# Patient Record
Sex: Female | Born: 1960 | Race: Black or African American | Hispanic: No | Marital: Married | Smoking: Never smoker
Health system: Southern US, Community
[De-identification: ages and names within clinical notes are randomized; demographics above are authoritative.]

## PROBLEM LIST (undated history)

## (undated) DIAGNOSIS — G4730 Sleep apnea, unspecified: Secondary | ICD-10-CM

## (undated) DIAGNOSIS — I10 Essential (primary) hypertension: Secondary | ICD-10-CM

## (undated) DIAGNOSIS — N189 Chronic kidney disease, unspecified: Secondary | ICD-10-CM

## (undated) HISTORY — PX: GASTRIC BYPASS: SHX52

## (undated) HISTORY — PX: TONSILLECTOMY: SUR1361

## (undated) HISTORY — PX: CHOLECYSTECTOMY: SHX55

## (undated) HISTORY — PX: ABDOMINAL HYSTERECTOMY: SHX81

## (undated) HISTORY — PX: BACK SURGERY: SHX140

## (undated) HISTORY — PX: NECK SURGERY: SHX720

## (undated) HISTORY — DX: Essential (primary) hypertension: I10

## (undated) HISTORY — PX: SHOULDER SURGERY: SHX246

---

## 2007-05-18 ENCOUNTER — Ambulatory Visit: Payer: Self-pay | Admitting: Emergency Medicine

## 2009-03-29 ENCOUNTER — Ambulatory Visit: Payer: Self-pay | Admitting: Specialist

## 2009-04-07 ENCOUNTER — Ambulatory Visit: Payer: Self-pay | Admitting: Specialist

## 2013-07-11 ENCOUNTER — Ambulatory Visit: Payer: Self-pay | Admitting: Family Medicine

## 2013-11-20 ENCOUNTER — Ambulatory Visit: Payer: Self-pay | Admitting: Pain Medicine

## 2013-11-26 ENCOUNTER — Ambulatory Visit: Payer: Self-pay | Admitting: Pain Medicine

## 2013-12-23 ENCOUNTER — Ambulatory Visit: Payer: Self-pay | Admitting: Pain Medicine

## 2014-01-12 ENCOUNTER — Ambulatory Visit: Payer: Self-pay | Admitting: Pain Medicine

## 2014-01-22 ENCOUNTER — Ambulatory Visit: Payer: Self-pay | Admitting: Pain Medicine

## 2014-01-28 ENCOUNTER — Ambulatory Visit: Payer: Self-pay | Admitting: Pain Medicine

## 2014-02-18 ENCOUNTER — Ambulatory Visit: Payer: Self-pay | Admitting: Pain Medicine

## 2014-03-18 ENCOUNTER — Ambulatory Visit: Payer: Self-pay | Admitting: Pain Medicine

## 2014-06-03 ENCOUNTER — Ambulatory Visit: Payer: Self-pay | Admitting: Pain Medicine

## 2014-06-29 ENCOUNTER — Ambulatory Visit: Payer: Self-pay | Admitting: Pain Medicine

## 2014-07-29 ENCOUNTER — Ambulatory Visit: Payer: Self-pay | Admitting: Pain Medicine

## 2014-09-01 ENCOUNTER — Ambulatory Visit: Payer: Self-pay | Admitting: Pain Medicine

## 2014-09-19 ENCOUNTER — Ambulatory Visit
Admit: 2014-09-19 | Disposition: A | Discharge status: Home / Self Care | Payer: Self-pay | Attending: Family Medicine | Admitting: Family Medicine

## 2014-10-01 ENCOUNTER — Ambulatory Visit
Admit: 2014-10-01 | Disposition: A | Discharge status: Home / Self Care | Payer: Self-pay | Attending: Pain Medicine | Admitting: Pain Medicine

## 2014-10-28 ENCOUNTER — Ambulatory Visit: Payer: 59 | Attending: Pain Medicine | Admitting: Pain Medicine

## 2014-10-28 ENCOUNTER — Encounter: Payer: Self-pay | Admitting: Pain Medicine

## 2014-10-28 ENCOUNTER — Encounter (INDEPENDENT_AMBULATORY_CARE_PROVIDER_SITE_OTHER): Payer: Self-pay

## 2014-10-28 VITALS — BP 137/77 | HR 58 | Temp 98.2°F | Resp 16 | Ht 69.0 in | Wt 215.0 lb

## 2014-10-28 DIAGNOSIS — M5030 Other cervical disc degeneration, unspecified cervical region: Secondary | ICD-10-CM | POA: Insufficient documentation

## 2014-10-28 DIAGNOSIS — M533 Sacrococcygeal disorders, not elsewhere classified: Secondary | ICD-10-CM | POA: Diagnosis not present

## 2014-10-28 DIAGNOSIS — M545 Low back pain: Secondary | ICD-10-CM | POA: Diagnosis present

## 2014-10-28 DIAGNOSIS — M47816 Spondylosis without myelopathy or radiculopathy, lumbar region: Secondary | ICD-10-CM | POA: Insufficient documentation

## 2014-10-28 DIAGNOSIS — M542 Cervicalgia: Secondary | ICD-10-CM | POA: Diagnosis present

## 2014-10-28 DIAGNOSIS — Z981 Arthrodesis status: Secondary | ICD-10-CM | POA: Diagnosis not present

## 2014-10-28 DIAGNOSIS — M5136 Other intervertebral disc degeneration, lumbar region: Secondary | ICD-10-CM | POA: Insufficient documentation

## 2014-10-28 DIAGNOSIS — M51369 Other intervertebral disc degeneration, lumbar region without mention of lumbar back pain or lower extremity pain: Secondary | ICD-10-CM | POA: Insufficient documentation

## 2014-10-28 DIAGNOSIS — M79605 Pain in left leg: Secondary | ICD-10-CM | POA: Diagnosis present

## 2014-10-28 MED ORDER — HYDROCODONE-ACETAMINOPHEN 5-325 MG PO TABS: ORAL_TABLET | ORAL | Status: DC

## 2014-10-28 MED ORDER — DICLOFENAC EPOLAMINE 1.3 % TD PTCH: MEDICATED_PATCH | TRANSDERMAL | Status: DC

## 2014-10-28 NOTE — Progress Notes (Signed)
Discharged at 1310, ambulatory

## 2014-10-28 NOTE — Patient Instructions (Signed)
Continue present medications.  F/U PCP for evaliation of  BP and general medical  condition.  F/U surgical evaluation.  F/U neurological evaluation.  May consider radiofrequency rhizolysis or intraspinal procedures pending response to present treatment and F/U evaluation.  Patient to call [facility] should patient have concerns prior to scheduled return appointment.  

## 2014-10-28 NOTE — Progress Notes (Signed)
   Subjective:    Patient ID: Nancy MoorsBerlinda Lunsford Bowlby, female    DOB: 11/12/1960, 54 y.o.   MRN: [id number]  HPI    Review of Systems     Objective:   Physical Exam        Assessment & Plan:

## 2014-10-28 NOTE — Progress Notes (Signed)
   Subjective:    Patient ID: Nancy MoorsBerlinda Lunsford Lormand, female    DOB: 03/29/1961, 54 y.o.   MRN: [id number]  HPI  The patient is a 54 year old female who [facility] for further evaluation and treatment of pain involving the lower back and lower extremity regions as well as the neck. The patient is with pain well-controlled at this time and continues her medications of hydrocodone and the use of the Flector patch. We will avoid interventional treatment and continue present treatment regimen. We'll also advise patient to continue her exercise routine which appears to be benefiting patient significantly.  Review of Systems     Objective:   Physical Exam  There was tenderness of the splenius capitis and occipitalis musculature regions of mild degree with well-healed surgical scar of the cervical region without increased warmth or erythema in the region of the scar. There was tenderness of the acromioclavicular and glenohumeral joint regions with patient being with what appeared to be bilaterally equal grip strength. Palpation over the thoracic facet reproducing minimal discomfort there was minimal increased pain with Tinel and Phalen's maneuver. Palpation over the lumbar facet lumbar paraspinal musculature region reproduces minimal discomfort. There was tenderness over the region of the lumbar paraspinal musculature region on the right greater than the left. Palpation of the PSIS and PII S regions reproduced mild discomfort and straight leg raising was tolerated to approximately 20 withquestionable increase of pain with dorsiflexion noted. Negative clonus negative Homans . Abdomen nontender and no costovertebral angle tenderness noted      Assessment & Plan:    Degenerative disc disease lumbar spine  Lumbar facet syndrome  Degenerative disc disease cervical spine  Cervical facet syndrome  Status post lumbar fusion  Status post cervical fusion  Sacroiliac joint  dysfunction    Plan  Continue present medications.  F/U PCP for evaliation of  BP and general medical  condition.  F/U surgical evaluation.  F/U neurological evaluation.  May consider radiofrequency rhizolysis or intraspinal procedures pending response to present treatment and F/U evaluation.  Patient to call [facility] should patient have concerns prior to scheduled return appointment.

## 2014-12-17 ENCOUNTER — Encounter: Payer: Self-pay | Admitting: Pain Medicine

## 2014-12-17 ENCOUNTER — Ambulatory Visit: Payer: 59 | Attending: Pain Medicine | Admitting: Pain Medicine

## 2014-12-17 VITALS — BP 115/60 | HR 61 | Temp 98.2°F | Resp 18 | Ht 69.0 in | Wt 215.0 lb

## 2014-12-17 DIAGNOSIS — R51 Headache: Secondary | ICD-10-CM | POA: Diagnosis present

## 2014-12-17 DIAGNOSIS — M5136 Other intervertebral disc degeneration, lumbar region: Secondary | ICD-10-CM | POA: Insufficient documentation

## 2014-12-17 DIAGNOSIS — M5481 Occipital neuralgia: Secondary | ICD-10-CM | POA: Diagnosis not present

## 2014-12-17 DIAGNOSIS — M5030 Other cervical disc degeneration, unspecified cervical region: Secondary | ICD-10-CM | POA: Insufficient documentation

## 2014-12-17 DIAGNOSIS — M533 Sacrococcygeal disorders, not elsewhere classified: Secondary | ICD-10-CM | POA: Diagnosis not present

## 2014-12-17 DIAGNOSIS — M47816 Spondylosis without myelopathy or radiculopathy, lumbar region: Secondary | ICD-10-CM

## 2014-12-17 DIAGNOSIS — Z9889 Other specified postprocedural states: Secondary | ICD-10-CM | POA: Insufficient documentation

## 2014-12-17 DIAGNOSIS — G43909 Migraine, unspecified, not intractable, without status migrainosus: Secondary | ICD-10-CM | POA: Insufficient documentation

## 2014-12-17 DIAGNOSIS — Z981 Arthrodesis status: Secondary | ICD-10-CM

## 2014-12-17 DIAGNOSIS — M542 Cervicalgia: Secondary | ICD-10-CM | POA: Diagnosis present

## 2014-12-17 MED ORDER — DICLOFENAC EPOLAMINE 1.3 % TD PTCH: MEDICATED_PATCH | TRANSDERMAL | Status: DC

## 2014-12-17 MED ORDER — HYDROCODONE-ACETAMINOPHEN 5-325 MG PO TABS: ORAL_TABLET | ORAL | Status: DC

## 2014-12-17 NOTE — Progress Notes (Signed)
Safety precautions to be maintained throughout the outpatient stay will include: orient to surroundings, keep bed in low position, maintain call bell within reach at all times, provide assistance with transfer out of bed and ambulation.  

## 2014-12-17 NOTE — Progress Notes (Signed)
Subjective:    Patient ID: Nancy Jacobson, female    DOB: 05/02/1961, 54 y.o.   MRN: [id number]  HPI  Patient is 54 year old female returns [facility] for further evaluation and treatment of pain involving the neck headaches lower back and lower extremity region. Patient states that her pain of the lower back and lower extremity regions fairly well-controlled at this time. Patient is with prior surgical intervention of the cervical region and states that she has significant pain occurring from the back of the neck radiating to the back of the head and especially on the right side. Patient denies trauma change in events of daily living the call significant change in symptomatology. We discussed patient's overall condition informed patient that she appears to have a component of greater occipital neuralgia contributing to headaches with pain in the cervical region and pain radiating cervical region to the occipital region of the head. So informed patient of the potential for the greater occipital neuralgia headaches to precipitate migraine headaches. After discussion of patient's condition and decision was made to schedule patient for greater occipital nerve block to be performed at time return appointment to decrease severity of patient's symptoms, minimize progression of symptoms, and avoid the need for more involved treatment. The patient was in agreement with suggested treatment plan. Patient will continue hydrocodone acetaminophen and Flector patch at this time as prescribed. The patient was understanding and agree with suggested treatment plan        Review of Systems     Objective:   Physical Exam    there was tenderness of the splenius capitis and occipitalis musculature region palpation was reproduced pain of moderate to moderately severe degree with severe increased pain on the right compared to the left. No new lesions of the head and neck were noted. There was  no excessive tends to palpation of the region of the sinuses. There was minimal tenderness of 10 to palpation of the temporomandibular joint region. Palpation of the splenius capitate and occipitalis musculature region reproduced severe discomfort. Patient appeared to be unremarkable Spurling's maneuver. Patient appeared to be with bilaterally equal grip strength. Tinel and Phalen's maneuver without increased pain of any significant degree. Palpation thoracic facet thoracic paraspinal musculature region was associated with increased pain of mild degree. No crepitus of the thoracic region was noted. Palpation over the lumbar paraspinal musculature region lumbar facet region what was tends to palpation of mild degree with lateral bending rotation extension and palpation of the lumbar facets reproducing mild discomfort. There was mild to moderate tenderness of the PSIS and PII S regions. Negative clonus negative Homans. DTRs difficult to the expected difficulty relaxing. Abdomen nontender with no costovertebral maintenance noted.      Assessment & Plan:    Degenerative disc disease cervical spine Status post surgery of cervical region  Bilateral occipital neuralgia  Migraine headaches (history of)  Sacroiliac joint dysfunction  Degenerative disc disease lumbar spine  Lumbar facet syndrome   Plan  Continue present medications Flector patch and hydrocodone acetaminophen  Greater occipital nerve block to be performed at time return appointment L Barry Dieneswens   F/U PCP for evaliation of  BP and general medical  Condition.  F/U podiatrist as discussed  F/U surgical evaluation  F/U neurological evaluation we discussed neurological reevaluation of headaches and we'll consider further evaluation in this regard as well   May consider radiofrequency rhizolysis or intraspinal procedures pending response to present treatment and F/U evaluation.  Patient to  call [facility] should patient  have concerns prior to scheduled return appointment.

## 2014-12-17 NOTE — Patient Instructions (Addendum)
Continue present medications Flector patch and hydrocodone acetaminophen  Greater occipital nerve block to be performed Monday, 12/28/2014  F/U PCP L Barry Dieneswens  for evaliation of  BP and general medical  condition.  F/U surgical evaluation  F/U podiatrist as planned status post surgery of foot  F/U neurological evaluation  May consider radiofrequency rhizolysis or intraspinal procedures pending response to present treatment and F/U evaluation.  Patient to call [facility] should patient have concerns prior to scheduled return appointment.  You were given a prescription for Hydrocodone today. A scriptOccipital Nerve Block Patient Information  Description: The occipital nerves originate in the cervical (neck) spinal cord and travel upward through muscle and tissue to supply sensation to the back of the head and top of the scalp.  In addition, the nerves control some of the muscles of the scalp.  Occipital neuralgia is an irritation of these nerves which can cause headaches, numbness of the scalp, and neck discomfort.     The occipital nerve block will interrupt nerve transmission through these nerves and can relieve pain and spasm.  The block consists of insertion of a small needle under the skin in the back of the head to deposit local anesthetic (numbing medicine) and/or steroids around the nerve.  The entire block usually lasts less than 5 minutes.  Conditions which may be treated by occipital blocks:   Muscular pain and spasm of the scalp  Nerve irritation, back of the head  Headaches  Upper neck pain  Preparation for the injection:  1. Do not eat any solid food or dairy products within 6 hours of your appointment. 2. You may drink clear liquids up to 2 hours before appointment.  Clear liquids include water, black coffee, juice or soda.  No milk or cream please. 3. You may take your regular medication, including pain medications, with a sip of water before you  appointment.  Diabetics should hold regular insulin (if taken separately) and take 1/2 normal NPH dose the morning of the procedure.  Carry some sugar containing items with you to your appointment. 4. A driver must accompany you and be prepared to drive you home after your procedure. 5. Bring all your current medications with you. 6. An IV may be inserted and sedation may be given at the discretion of the physician. 7. A blood pressure cuff, EKG, and other monitors will often be applied during the procedure.  Some patients may need to have extra oxygen administered for a short period. 8. You will be asked to provide medical information, including your allergies and medications, prior to the procedure.  We must know immediately if you are taking blood thinners (like Coumadin/Warfarin) or if you are allergic to IV iodine contrast (dye).  We must know if you could possible be pregnant.  9. Do not wear a high collared shirt or turtleneck.  Tie long hair up in the back if possible.  Possible side-effects:   Bleeding from needle site  Infection (rare, may require surgery)  Nerve injury (rare)  Hair on back of neck can be tinged with iodine scrub (this will wash out)  Light-headedness (temporary)  Pain at injection site (several days)  Decreased blood pressure (rare, temporary)  Seizure (very rare)  Call if you experience:   Hives or difficulty breathing ( go to the emergency room)  Inflammation or drainage at the injection site(s)  Please note:  Although the local anesthetic injected can often make your painful muscles or headache feel good for  several hours after the injection, the pain may return.  It takes 3-7 days for steroids to work.  You may not notice any pain relief for at least one week.  If effective, we will often do a series of injections spaced 3-6 weeks apart to maximally decrease your pain.  If you have any questions, please call [phone] [facility]  for Flector patch was sent to your pharmacy.

## 2014-12-28 ENCOUNTER — Ambulatory Visit: Payer: 59 | Attending: Pain Medicine | Admitting: Pain Medicine

## 2014-12-28 VITALS — BP 123/63 | HR 59 | Temp 98.6°F | Resp 16 | Ht 69.0 in | Wt 215.0 lb

## 2014-12-28 DIAGNOSIS — M533 Sacrococcygeal disorders, not elsewhere classified: Secondary | ICD-10-CM

## 2014-12-28 DIAGNOSIS — M5481 Occipital neuralgia: Secondary | ICD-10-CM | POA: Insufficient documentation

## 2014-12-28 DIAGNOSIS — M5030 Other cervical disc degeneration, unspecified cervical region: Secondary | ICD-10-CM

## 2014-12-28 DIAGNOSIS — M47816 Spondylosis without myelopathy or radiculopathy, lumbar region: Secondary | ICD-10-CM

## 2014-12-28 DIAGNOSIS — M47812 Spondylosis without myelopathy or radiculopathy, cervical region: Secondary | ICD-10-CM | POA: Insufficient documentation

## 2014-12-28 DIAGNOSIS — R51 Headache: Secondary | ICD-10-CM | POA: Insufficient documentation

## 2014-12-28 DIAGNOSIS — M5136 Other intervertebral disc degeneration, lumbar region: Secondary | ICD-10-CM

## 2014-12-28 DIAGNOSIS — Z981 Arthrodesis status: Secondary | ICD-10-CM

## 2014-12-28 DIAGNOSIS — M542 Cervicalgia: Secondary | ICD-10-CM | POA: Diagnosis present

## 2014-12-28 MED ORDER — MIDAZOLAM HCL 5 MG/5ML IJ SOLN
INTRAMUSCULAR | Status: AC
Start: 2014-12-28 — End: 2014-12-28
  Administered 2014-12-28: 3 mg via INTRAVENOUS
  Filled 2014-12-28: qty 5

## 2014-12-28 MED ORDER — CIPROFLOXACIN HCL 250 MG PO TABS: 250.0000 mg | ORAL_TABLET | Freq: Two times a day (BID) | ORAL | Status: DC

## 2014-12-28 MED ORDER — CIPROFLOXACIN IN D5W 400 MG/200ML IV SOLN
INTRAVENOUS | Status: AC
Start: 2014-12-28 — End: 2014-12-28
  Administered 2014-12-28: 12:00:00
  Filled 2014-12-28: qty 200

## 2014-12-28 MED ORDER — BUPIVACAINE HCL (PF) 0.25 % IJ SOLN
INTRAMUSCULAR | Status: AC
  Administered 2014-12-28: 12:00:00
  Filled 2014-12-28: qty 30

## 2014-12-28 MED ORDER — TRIAMCINOLONE ACETONIDE 40 MG/ML IJ SUSP
INTRAMUSCULAR | Status: AC
Start: 2014-12-28 — End: 2014-12-28
  Administered 2014-12-28: 12:00:00
  Filled 2014-12-28: qty 1

## 2014-12-28 MED ORDER — FENTANYL CITRATE (PF) 100 MCG/2ML IJ SOLN
INTRAMUSCULAR | Status: AC
  Administered 2014-12-28: 100 ug via INTRAVENOUS
  Filled 2014-12-28: qty 2

## 2014-12-28 MED ORDER — ORPHENADRINE CITRATE 30 MG/ML IJ SOLN
INTRAMUSCULAR | Status: AC
  Administered 2014-12-28: 12:00:00
  Filled 2014-12-28: qty 2

## 2014-12-28 NOTE — Patient Instructions (Addendum)
Continue present medications Flector patch and hydrocodone acetaminophen and begin taking your antibiotic Cipro today  F/U PCP L Owens for evaliation of  BP and general medical  condition.  F/U surgical evaluation  F/U neurological evaluation  May consider radiofrequency rhizolysis or intraspinal procedures pending response to present treatment and F/U evaluation.  Patient to call [facility] should patient have concerns prior to scheduled return appointment.

## 2014-12-28 NOTE — Progress Notes (Signed)
Safety precautions to be maintained throughout the outpatient stay will include: orient to surroundings, keep bed in low position, maintain call bell within reach at all times, provide assistance with transfer out of bed and ambulation.  

## 2014-12-28 NOTE — Progress Notes (Signed)
   Subjective:    Patient ID: Nancy Jacobson, female    DOB: [birth year], 54 y.o.   MRN: [id number]  HPI  NOTE: The patient is a 54 y.o.-year-old female who [facility] the [facility] for further evaluation and treatment of pain consisting of pain involving the region of the neck and headache.  Patient is with prior studies revealing patient to be with degenerative changes of the cervical spine with prior surgical intervention of the cervical region. Patient is with what appears to be significant component of headaches due to greater occipital neuralgia involving the left and right sides in addition to cervicogenic headaches and concern regarding component of migraine. At the present time patient's pain appears to be with significant component of bilateral occipital neuralgia .  The risks, benefits, and expectations of the procedure have been discussed and explained to patient, who is understanding and wishes to proceed with interventional treatment as discussed and as explained to patient.  Will proceed with greater occipital nerve blocks with myoneural block injections at this time as discussed and as explained to patient.  All are understanding and in agreement with suggested treatment plan.    PROCEDURE:  Greater occipital nerve block on the left side with IV Versed, IV Fentanyl, conscious sedation, EKG, blood pressure, pulse, pulse oximetry monitoring.  Procedure performed with patient in prone position.  Greater occipital nerve block on the left side.   With patient in prone position, Betadine prep of proposed entry site accomplished.  Following identification of the nuchal ridge, 22 -gauge needle was inserted at the level of the nuchal ridge medial to the occipital artery.  Following negative aspiration, 4cc 0.25% bupivacaine with Kenalog injected for left greater occipital nerve block.  Needle was removed.  Patient tolerated injection well.   Greater occipital nerve block on  the rightt side. The greater occipital nerve block on the right side was performed exactly as the left greater occipital nerve block was performed and utilizing the same technique.  Myoneural block injections of the cervical paraspinal musculature region Following Betadine prep of proposed entry site a 22-gauge needle was inserted in the cervical paraspinal musculature region and following negative aspiration 2 cc of 0.25% bupivacaine with Norflex was injected for myoneural block injections of the cervical paraspinal musculature region.   A total of 10 mg Kenalog was utilized for the entire procedure.  PLAN:    1. Medications: Will continue presently prescribed medications at this time. Flector patch and hydrocodone acetaminophen 2. Patient to follow up with primary care physician Bud Face for evaluation of blood pressure and general medical condition status post procedure performed on today's visit. 3. Neurological evaluation for further assessment of headaches for further studies as discussed. 4. Surgical evaluation as discussed.  5. F/U Dr.Hyatt as discussed  6. Patient may be candidate for Botox injections, radiofrequency procedures, as well as implantation type procedures pending response to treatment rendered on today's visit and pending follow-up evaluation. 7. Patient has been advised to adhere to proper body mechanics and to avoid activities which appear to aggravate condition.cations:  Will continue presently prescribed medications at this time. 8. The patient is understanding and in agreement with the suggested treatment plan.     Review of Systems     Objective:   Physical Exam        Assessment & Plan:

## 2014-12-29 ENCOUNTER — Telehealth: Payer: Self-pay | Admitting: *Deleted

## 2014-12-29 NOTE — Telephone Encounter (Signed)
voicemail

## 2014-12-31 ENCOUNTER — Other Ambulatory Visit: Payer: Self-pay | Admitting: Pain Medicine

## 2015-01-19 ENCOUNTER — Ambulatory Visit: Payer: 59 | Attending: Pain Medicine | Admitting: Pain Medicine

## 2015-01-19 VITALS — BP 126/71 | HR 64 | Temp 98.1°F | Resp 16 | Ht 69.0 in | Wt 215.0 lb

## 2015-01-19 DIAGNOSIS — Z981 Arthrodesis status: Secondary | ICD-10-CM

## 2015-01-19 DIAGNOSIS — M5030 Other cervical disc degeneration, unspecified cervical region: Secondary | ICD-10-CM | POA: Diagnosis not present

## 2015-01-19 DIAGNOSIS — M51369 Other intervertebral disc degeneration, lumbar region without mention of lumbar back pain or lower extremity pain: Secondary | ICD-10-CM

## 2015-01-19 DIAGNOSIS — G43909 Migraine, unspecified, not intractable, without status migrainosus: Secondary | ICD-10-CM | POA: Insufficient documentation

## 2015-01-19 DIAGNOSIS — M47816 Spondylosis without myelopathy or radiculopathy, lumbar region: Secondary | ICD-10-CM

## 2015-01-19 DIAGNOSIS — M542 Cervicalgia: Secondary | ICD-10-CM | POA: Diagnosis present

## 2015-01-19 DIAGNOSIS — M5481 Occipital neuralgia: Secondary | ICD-10-CM

## 2015-01-19 DIAGNOSIS — Z9889 Other specified postprocedural states: Secondary | ICD-10-CM | POA: Diagnosis not present

## 2015-01-19 DIAGNOSIS — R51 Headache: Secondary | ICD-10-CM | POA: Diagnosis present

## 2015-01-19 DIAGNOSIS — M533 Sacrococcygeal disorders, not elsewhere classified: Secondary | ICD-10-CM | POA: Diagnosis not present

## 2015-01-19 DIAGNOSIS — M5136 Other intervertebral disc degeneration, lumbar region: Secondary | ICD-10-CM | POA: Diagnosis not present

## 2015-01-19 DIAGNOSIS — M545 Low back pain: Secondary | ICD-10-CM | POA: Diagnosis present

## 2015-01-19 MED ORDER — HYDROCODONE-ACETAMINOPHEN 5-325 MG PO TABS: ORAL_TABLET | ORAL | Status: DC

## 2015-01-19 MED ORDER — GABAPENTIN 300 MG PO CAPS: ORAL_CAPSULE | ORAL | Status: DC

## 2015-01-19 NOTE — Progress Notes (Signed)
   Subjective:    Patient ID: Nancy Jacobson, female    DOB: 1960-08-10, 54 y.o.   MRN: [id number]  HPI  Patient is 54 year old female who [facility] for further evaluation and treatment of pain occurring in the recent the neck associated with headaches as well as upper mid lower back and lower extremity pain. Patient is to some pain occurring in the region of the lower back and buttock especially on the right. Modification of medications and we'll avoid nonsteroidal anti-inflammatory medications at this time. We will prescribed Neurontin and observe patient's response to the Neurontin and remain available to consider for interventional treatment and additional modifications of treatment pending response to Neurontin and follow-up evaluation. The patient was understanding and in agreement status treatment plan. Continue the use of the Flector patch and the hydrocodone acetaminophen at this time. The patient was understanding and in agreement status treatment plan    Review of Systems     Objective:   Physical Exam  There was tenderness over the splenius capitis and occipitalis musculature region of mild degree. There was mild tinnitus of the cervical facet cervical paraspinal musculature region. Appeared to be unremarkable Spurling's maneuver. Palpation over the thoracic facet thoracic paraspinal muscle region was with mild to moderate tends to palpation and no crepitus of the thoracic region was noted. Patient was a tends to palpation of the acromioclavicular glenohumeral joint region a mild degree and appeared to be with bilaterally equal grip strength. Tinel and Phalen's maneuver without increase of pain of significant degree.   Palpation over the lumbar paraspinal muscles region lumbar facet region was with increased pain with extension and palpation of the lumbar facets reproducing moderate discomfort on the right compared to the left. There was tenderness in  the region of the PSIS and PII S region on the right greater than the left. Mild tinnitus of the greater trochanteric region and iliotibial band region was noted. We'll tolerates approximately 30 without a definite increased pain with dorsiflexion noted. Well-healed scars of the feet were noted without increased warmth and erythema in the region of the head clonus negative Homans. Abdomen soft nontender and no costovertebral angle tenderness was noted.    Assessment & Plan:    Degenerative disc disease cervical spine Status post surgery of cervical region  Bilateral occipital neuralgia  Migraine headaches (history of)  Sacroiliac joint dysfunction  Degenerative disc disease lumbar spine  Lumbar facet syndrome   Plan  Continue present medications Flector patch and hydrocodone acetaminophen and begin Neurontin with caution to avoid excessive sedation posterior depression confusion and other side effects as discussed   F/U PCP L owensfor evaliation of  BP and general medical  condition  F/U surgical evaluation  F/U neurological evaluation  F/U podiatrist as needed  May consider radiofrequency rhizolysis or intraspinal procedures pending response to present treatment and F/U evaluation   Patient to call [facility] should patient have concerns prior to scheduled return appointmen.

## 2015-01-19 NOTE — Progress Notes (Signed)
Safety precautions to be maintained throughout the outpatient stay will include: orient to surroundings, keep bed in low position, maintain call bell within reach at all times, provide assistance with transfer out of bed and ambulation.  

## 2015-01-19 NOTE — Patient Instructions (Addendum)
Continue present medications Flector patch and hydrocodone acetaminophen and begin Neurontin as discussed. Neurontin may cause drowsiness confusion and other side effects  F/U PCP L Owens for evaliation of  BP and general medical  condition  F/U surgical evaluation  F/U neurological evaluation  F/U podiatrist as needed  May consider radiofrequency rhizolysis or intraspinal procedures pending response to present treatment and F/U evaluation   Patient to call [facility] should patient have concerns prior to scheduled return appointmen.

## 2015-02-18 ENCOUNTER — Encounter: Payer: Self-pay | Admitting: Pain Medicine

## 2015-02-18 ENCOUNTER — Ambulatory Visit: Payer: 59 | Attending: Pain Medicine | Admitting: Pain Medicine

## 2015-02-18 VITALS — BP 130/80 | HR 61 | Temp 96.3°F | Resp 18 | Ht 69.0 in | Wt 215.0 lb

## 2015-02-18 DIAGNOSIS — M47816 Spondylosis without myelopathy or radiculopathy, lumbar region: Secondary | ICD-10-CM

## 2015-02-18 DIAGNOSIS — M533 Sacrococcygeal disorders, not elsewhere classified: Secondary | ICD-10-CM | POA: Insufficient documentation

## 2015-02-18 DIAGNOSIS — M5136 Other intervertebral disc degeneration, lumbar region: Secondary | ICD-10-CM | POA: Diagnosis not present

## 2015-02-18 DIAGNOSIS — M5030 Other cervical disc degeneration, unspecified cervical region: Secondary | ICD-10-CM | POA: Diagnosis not present

## 2015-02-18 DIAGNOSIS — M542 Cervicalgia: Secondary | ICD-10-CM | POA: Diagnosis present

## 2015-02-18 DIAGNOSIS — G43909 Migraine, unspecified, not intractable, without status migrainosus: Secondary | ICD-10-CM | POA: Insufficient documentation

## 2015-02-18 DIAGNOSIS — M5481 Occipital neuralgia: Secondary | ICD-10-CM | POA: Diagnosis not present

## 2015-02-18 DIAGNOSIS — Z9889 Other specified postprocedural states: Secondary | ICD-10-CM | POA: Insufficient documentation

## 2015-02-18 DIAGNOSIS — R51 Headache: Secondary | ICD-10-CM | POA: Diagnosis present

## 2015-02-18 DIAGNOSIS — Z981 Arthrodesis status: Secondary | ICD-10-CM

## 2015-02-18 MED ORDER — HYDROCODONE-ACETAMINOPHEN 5-325 MG PO TABS: ORAL_TABLET | ORAL | Status: DC

## 2015-02-18 MED ORDER — GABAPENTIN 100 MG PO CAPS: ORAL_CAPSULE | ORAL | Status: DC

## 2015-02-18 NOTE — Progress Notes (Signed)
Subjective:    Patient ID: Nancy Jacobson, female    DOB: 1961-05-23, 54 y.o.   MRN: [id number]  HPI  Patient is 54 year old female returns to [facility] for further evaluation and treatment of pain involving the region of the neck upper extremity regions headache of the mid lower back and lower extremity regions. At the present time patient states pain is fairly well-controlled with lower back lower extremity pain with pain radiating to the buttocks on the left and right been the most bothersome pain. Patient has been significantly for pain following previous interventional treatment performed in [facility] with greater than 50% relief of pain following procedures. At the present time we will proceed with scheduling patient for block of nerves to the sacroiliac joint at time return appointment in attempt to decrease severity of symptoms, minimize progression of symptoms, and avoid need for more involved treatment. We'll proceed with what is felt to be medically necessary procedure block of nerves to the sacroiliac joint at time return appointment. We will continue medications Neurontin Flexeril patch and hydrocodone acetaminophen as prescribed with reduction of Neurontin from entry 100 mg 20/100 milligrams size to avoid undesirable side effects. The patient was cautioned regarding the size change in terms of dosage of the medication. Patient will exercise extreme precautions to avoid undesirable side effects of respiratory depression confusion and other undesirable side effects. We will proceed with block of nerves to the sacroiliac joint at time return appointment as discussed area patient denies trauma change in events of daily living the cause change in symptomatology. Patient agrees is to treatment plan    Review of Systems     Objective:   Physical Exam  Mild tenderness was noted of the splenius capitis and occipitalis musculature region. No excessive tends  to palpation of the cervical facet cervical paraspinal muscles noted. Well-healed surgical scar of the cervical region without increased warmth or erythema scar. Patient was with slightly decreased grip strength. Tinel and Phalen's maneuver were without increase of pain of significant degree. Palpation of the cervical facet cervical paraspinal muscles and thoracic facet thoracic paraspinal musculature region reproduced minimal discomfort with no crepitus of the thoracic region noted. There appeared to be unremarkable Spurling's maneuver. Palpation over the lumbar paraspinal muscles lumbar facet region was a tends to palpation of moderate moderately severe degree. Lateral bending and rotation and extension and palpation of the lumbar facets reproduce moderate to moderately severe discomfort. Palpation of the PSIS and PII S region reproduced moderate to moderately severe discomfort. Patrick's maneuver wasn't increased pain of moderate degree. There was decreased EHL strength with no definite sensory deficit of dermatomal distribution detected. Straight leg raising tolerates approximately 20 without an increase of pain with dorsiflexion noted. There was negative clonus negative Homans. Abdomen was nontender with no costovertebral angle tenderness noted.        Assessment & Plan:    Degenerative disc disease cervical spine Status post surgery of cervical region  Bilateral occipital neuralgia  Migraine headaches (history of)  Sacroiliac joint dysfunction  Degenerative disc disease lumbar spine  Lumbar facet syndrom    PLAN   Continue present medication Neurontin Flector patch and hydrocodone acetaminophen and. The Neurontin dose was changed from a 300 mg Neurontin to a 100 mg Neurontin. Patient was cautioned regarding respiratory depression confusion and other side effects which can occur with the medication  Block of nerves to the sacroiliac joint to be performed at time return  appointment  F/U  PCP Barry Dienes for evaliation of  BP and general medical  condition  F/U surgical evaluation. May consider pending follow-up evaluations  F/U neurological evaluation. May consider pending follow-up evaluations  May consider radiofrequency rhizolysis or intraspinal procedures pending response to present treatment and F/U evaluation   Patient to call [facility] should patient have concerns prior to scheduled return appointment.

## 2015-02-18 NOTE — Patient Instructions (Addendum)
PLAN   Continue present medication Neurontin Flector patch and hydrocodone acetaminophen. Please note that Neurontin is now 100 mg size as we discussed. Do Not Take Neurontin 300 mg pills  Block of nerves to the sacroiliac joint to be performed at time of return appointment  F/U  L Owens for evaliation of  BP and general medical  condition  F/U surgical evaluation. May consider pending follow-up evaluations  F/U neurological evaluation. May consider pending follow-up evaluations  May consider radiofrequency rhizolysis or intraspinal procedures pending response to present treatment and F/U evaluation   Patient to call [facility] Management Center should patient have concerns prior to scheduled return appointment. Sacroiliac (SI) Joint Injection Patient Information  Description: The sacroiliac joint connects the scrum (very low back and tailbone) to the ilium (a pelvic bone which also forms half of the hip joint).  Normally this joint experiences very little motion.  When this joint becomes inflamed or unstable low back and or hip and pelvis pain may result.  Injection of this joint with local anesthetics (numbing medicines) and steroids can provide diagnostic information and reduce pain.  This injection is performed with the aid of x-ray guidance into the tailbone area while you are lying on your stomach.   You may experience an electrical sensation down the leg while this is being done.  You may also experience numbness.  We also may ask if we are reproducing your normal pain during the injection.  Conditions which may be treated SI injection:   Low back, buttock, hip or leg pain  Preparation for the Injection:  1. Do not eat any solid food or dairy products within 6 hours of your appointment.  2. You may drink clear liquids up to 2 hours before appointment.  Clear liquids include water, black coffee, juice or soda.  No milk or cream please. 3. You may take your regular medications,  including pain medications with a sip of water before your appointment.  Diabetics should hold regular insulin (if take separately) and take 1/2 normal NPH dose the morning of the procedure.  Carry some sugar containing items with you to your appointment. 4. A driver must accompany you and be prepared to drive you home after your procedure. 5. Bring all of your current medications with you. 6. An IV may be inserted and sedation may be given at the discretion of the physician. 7. A blood pressure cuff, EKG and other monitors will often be applied during the procedure.  Some patients may need to have extra oxygen administered for a short period.  8. You will be asked to provide medical information, including your allergies, prior to the procedure.  We must know immediately if you are taking blood thinners (like Coumadin/Warfarin) or if you are allergic to IV iodine contrast (dye).  We must know if you could possible be pregnant.  Possible side effects:   Bleeding from needle site  Infection (rare, may require surgery)  Nerve injury (rare)  Numbness & tingling (temporary)  A brief convulsion or seizure  Light-headedness (temporary)  Pain at injection site (several days)  Decreased blood pressure (temporary)  Weakness in the leg (temporary)   Call if you experience:   New onset weakness or numbness of an extremity below the injection site that last more than 8 hours.  Hives or difficulty breathing ( go to the emergency room)  Inflammation or drainage at the injection site  Any new symptoms which are concerning to you  Please note:  Although the local anesthetic injected can often make your back/ hip/ buttock/ leg feel good for several hours after the injections, the pain will likely return.  It takes 3-7 days for steroids to work in the sacroiliac area.  You may not notice any pain relief for at least that one week.  If effective, we will often do a series of three injections  spaced 3-6 weeks apart to maximally decrease your pain.  After the initial series, we generally will wait some months before a repeat injection of the same type.  If you have any questions, please call [phone] [facility]

## 2015-02-18 NOTE — Progress Notes (Signed)
Safety precautions to be maintained throughout the outpatient stay will include: orient to surroundings, keep bed in low position, maintain call bell within reach at all times, provide assistance with transfer out of bed and ambulation.  

## 2015-03-03 ENCOUNTER — Ambulatory Visit: Payer: 59 | Attending: Pain Medicine | Admitting: Pain Medicine

## 2015-03-03 ENCOUNTER — Encounter: Payer: Self-pay | Admitting: Pain Medicine

## 2015-03-03 VITALS — BP 115/66 | HR 55 | Temp 98.0°F | Resp 18 | Ht 68.0 in | Wt 210.0 lb

## 2015-03-03 DIAGNOSIS — M47816 Spondylosis without myelopathy or radiculopathy, lumbar region: Secondary | ICD-10-CM

## 2015-03-03 DIAGNOSIS — M5136 Other intervertebral disc degeneration, lumbar region: Secondary | ICD-10-CM | POA: Insufficient documentation

## 2015-03-03 DIAGNOSIS — M533 Sacrococcygeal disorders, not elsewhere classified: Secondary | ICD-10-CM

## 2015-03-03 DIAGNOSIS — M79605 Pain in left leg: Secondary | ICD-10-CM | POA: Diagnosis present

## 2015-03-03 DIAGNOSIS — M545 Low back pain: Secondary | ICD-10-CM | POA: Diagnosis present

## 2015-03-03 DIAGNOSIS — M5030 Other cervical disc degeneration, unspecified cervical region: Secondary | ICD-10-CM

## 2015-03-03 DIAGNOSIS — Z981 Arthrodesis status: Secondary | ICD-10-CM

## 2015-03-03 DIAGNOSIS — M5481 Occipital neuralgia: Secondary | ICD-10-CM

## 2015-03-03 DIAGNOSIS — M79604 Pain in right leg: Secondary | ICD-10-CM | POA: Diagnosis present

## 2015-03-03 MED ORDER — KETOROLAC TROMETHAMINE 60 MG/2ML IM SOLN
INTRAMUSCULAR | Status: AC
  Administered 2015-03-03: 60 mg
  Filled 2015-03-03: qty 2

## 2015-03-03 MED ORDER — BUPIVACAINE HCL (PF) 0.25 % IJ SOLN
INTRAMUSCULAR | Status: AC
  Administered 2015-03-03: 30 mL
  Filled 2015-03-03: qty 30

## 2015-03-03 MED ORDER — FENTANYL CITRATE (PF) 100 MCG/2ML IJ SOLN
INTRAMUSCULAR | Status: AC
  Administered 2015-03-03: 100 ug via INTRAVENOUS
  Filled 2015-03-03: qty 2

## 2015-03-03 MED ORDER — MIDAZOLAM HCL 5 MG/5ML IJ SOLN
INTRAMUSCULAR | Status: AC
  Administered 2015-03-03: 5 mg via INTRAVENOUS
  Filled 2015-03-03: qty 5

## 2015-03-03 MED ORDER — ORPHENADRINE CITRATE 30 MG/ML IJ SOLN
INTRAMUSCULAR | Status: AC
  Administered 2015-03-03: 60 mg
  Filled 2015-03-03: qty 2

## 2015-03-03 MED ORDER — TRIAMCINOLONE ACETONIDE 40 MG/ML IJ SUSP
INTRAMUSCULAR | Status: DC
Start: 2015-03-03 — End: 2015-03-03
  Filled 2015-03-03: qty 1

## 2015-03-03 NOTE — Progress Notes (Signed)
Safety precautions to be maintained throughout the outpatient stay will include: orient to surroundings, keep bed in low position, maintain call bell within reach at all times, provide assistance with transfer out of bed and ambulation.  

## 2015-03-03 NOTE — Progress Notes (Signed)
Subjective:    Patient ID: Nancy Jacobson, female    DOB: 1960-09-14, 54 y.o.   MRN: [id number]  HPI NOTE:  The patient is a 54 y.o. female who returns to the [facility] for further evaluation and treatment of pain involving the lumbar and lower extremity region with pain involving the region of the hip and buttocks as well.  Prior studies reveal patient to be with evidence of  MRI evidence of degenerative disc disease lumbar spine. The patient is with reproduction of severe pain with palpation over the PSIS and PII S regions and is with positive Patrick's maneuver as well  There is concern regarding patient's pain being due to significant component of sacroiliac joint dysfunction. We have discussed patient's condition.  The risks, benefits, and expectations of the procedure have been discussed and explained to the patient who is understanding and wishes to proceed with interventional treatment as planned.    PROCEDURE: Sacroiliac joint on the  left side injection with IV Versed, IV Fentanyl conscious sedation, EKG, blood pressure, pulse, pulse oximetry monitoring.  Procedure was performed with patient in prone position under fluoroscopic guidance.    Inferior pole sacroiliac joint injection on the  left side.  With patient in prone position and Betadine prep of proposed entry site of accomplished, 22 -gauge needle was inserted at the inferior pole of the sacroiliac joint on the left under fluoroscopic guidance.  Following documentation of needle placement at the inferior pole of the sacroiliac joint on the left, a total of 1cc 0.25% bupivacaine with  Toradol was injected for  left inferior pole sacroiliac joint injection.  Needle was removed.    Superior pole sacroiliac joint injection on the  left side.  With patient in prone position and Betadine prep of proposed entry site of accomplished, 22-gauge needle was inserted at the superior pole of the sacroiliac joint on the left  under fluoroscopic guidance.  Following documentation of needle placement at the inferior pole of the sacroiliac joint on the left, a total of 1cc 0.25% bupivacaine with  Toradolwas injected for  left superior pole sacroiliac joint injection.  Needle was removed.   SACROILIAC JOINT INJECTION ON THE RIGHT SIDE  The procedure was performed on the right side exactly as was performed on the left side utilizing the same technique and under fluoroscopic guidance for both superior pole sacroiliac joint injections and inferior pole sacroiliac joint injections on the right side     Patient tolerated procedure well.  A total of  60 mg of Toradol was utilized for the entire procedure.  PLAN:    1. Medications: Will continue presently prescribed medications.  Neurontin hydrocodone acetaminophen and Flector patch 2. Patient to follow up with primary care physician Bud Face for evaluation of blood pressure and general medical condition status post sacroiliac joint injection performed on today's visit. 3. Surgical evaluation is discussed. May consider further surgical evaluation  Of lumbar and lower extremity pain. Patient will follow up with her podiatrist as needed 4. Neurological evaluation with PNCV/EMG studies  may be considered 5. Patient may be candidate for radiofrequency procedures, Botox injections, implantation-type procedures, and other treatment pending response to treatment and follow-up evaluation. 6. Patient has been advised to adhere to proper body mechanics and to call Pain Management Center prior to scheduled return appointment should there be significant change in condition or patient have other concerns regarding condition prior to scheduled return appointment.    Patient with understanding and in  agreement with suggested treatment plan.     Review of Systems     Objective:   Physical Exam        Assessment & Plan:

## 2015-03-03 NOTE — Progress Notes (Signed)
Had flu shot 02/26/15

## 2015-03-03 NOTE — Patient Instructions (Addendum)
PLAN  Continue present medication Neurontin Flector patch and hydrocodone acetaminophen and begin taking antibiotic Cipro as prescribed. Please obtain your antibiotic Cipro take today and begin taking antibiotic today  F/U PCP  L Owens for evaliation of  BP and general medical  condition.  F/U surgical evaluation. May consider pending follow-up evaluations  F/U neurological evaluation. May consider pending follow-up evaluations  F/U podiatrists as needed  May consider radiofrequency rhizolysis or intraspinal procedures pending response to present treatment and F/U evaluation.  Patient to call Pain Management Center should patient have concerns prior to scheduled return appointment.  Pain Management Discharge Instructions  General Discharge Instructions :  If you need to reach your doctor call: Monday-Friday 8:00 am - 4:00 pm at [phone] or toll free [phone].  After clinic hours [phone] to have operator reach doctor.  Bring all of your medication bottles to all your appointments in the pain clinic.  To cancel or reschedule your appointment with Pain Management please remember to call 24 hours in advance to avoid a fee.  Refer to the educational materials which you have been given on: General Risks, I had my Procedure. Discharge Instructions, Post Sedation.  Post Procedure Instructions:  The drugs you were given will stay in your system until tomorrow, so for the next 24 hours you should not drive, make any legal decisions or drink any alcoholic beverages.  You may eat anything you prefer, but it is better to start with liquids then soups and crackers, and gradually work up to solid foods.  Please notify your doctor immediately if you have any unusual bleeding, trouble breathing or pain that is not related to your normal pain.  Depending on the type of procedure that was done, some parts of your body may feel week and/or numb.  This usually clears up by tonight or the  next day.  Walk with the use of an assistive device or accompanied by an adult for the 24 hours.  You may use ice on the affected area for the first 24 hours.  Put ice in a Ziploc bag and cover with a towel and place against area 15 minutes on 15 minutes off.  You may switch to heat after 24 hours.Sacroiliac (SI) Joint Injection Patient Information  Description: The sacroiliac joint connects the scrum (very low back and tailbone) to the ilium (a pelvic bone which also forms half of the hip joint).  Normally this joint experiences very little motion.  When this joint becomes inflamed or unstable low back and or hip and pelvis pain may result.  Injection of this joint with local anesthetics (numbing medicines) and steroids can provide diagnostic information and reduce pain.  This injection is performed with the aid of x-ray guidance into the tailbone area while you are lying on your stomach.   You may experience an electrical sensation down the leg while this is being done.  You may also experience numbness.  We also may ask if we are reproducing your normal pain during the injection.  Conditions which may be treated SI injection:   Low back, buttock, hip or leg pain  Preparation for the Injection:  1. Do not eat any solid food or dairy products within 6 hours of your appointment.  2. You may drink clear liquids up to 2 hours before appointment.  Clear liquids include water, black coffee, juice or soda.  No milk or cream please. 3. You may take your regular medications, including pain medications with a sip of  water before your appointment.  Diabetics should hold regular insulin (if take separately) and take 1/2 normal NPH dose the morning of the procedure.  Carry some sugar containing items with you to your appointment. 4. A driver must accompany you and be prepared to drive you home after your procedure. 5. Bring all of your current medications with you. 6. An IV may be inserted and sedation  may be given at the discretion of the physician. 7. A blood pressure cuff, EKG and other monitors will often be applied during the procedure.  Some patients may need to have extra oxygen administered for a short period.  8. You will be asked to provide medical information, including your allergies, prior to the procedure.  We must know immediately if you are taking blood thinners (like Coumadin/Warfarin) or if you are allergic to IV iodine contrast (dye).  We must know if you could possible be pregnant.  Possible side effects:   Bleeding from needle site  Infection (rare, may require surgery)  Nerve injury (rare)  Numbness & tingling (temporary)  A brief convulsion or seizure  Light-headedness (temporary)  Pain at injection site (several days)  Decreased blood pressure (temporary)  Weakness in the leg (temporary)   Call if you experience:   New onset weakness or numbness of an extremity below the injection site that last more than 8 hours.  Hives or difficulty breathing ( go to the emergency room)  Inflammation or drainage at the injection site  Any new symptoms which are concerning to you  Please note:  Although the local anesthetic injected can often make your back/ hip/ buttock/ leg feel good for several hours after the injections, the pain will likely return.  It takes 3-7 days for steroids to work in the sacroiliac area.  You may not notice any pain relief for at least that one week.  If effective, we will often do a series of three injections spaced 3-6 weeks apart to maximally decrease your pain.  After the initial series, we generally will wait some months before a repeat injection of the same type.  If you have any questions, please call [phone] [facility]

## 2015-03-04 ENCOUNTER — Telehealth: Payer: Self-pay | Admitting: *Deleted

## 2015-03-04 NOTE — Telephone Encounter (Signed)
Denies complications post procedure. 

## 2015-03-23 ENCOUNTER — Encounter: Payer: Self-pay | Admitting: Pain Medicine

## 2015-03-23 ENCOUNTER — Ambulatory Visit: Payer: 59 | Attending: Pain Medicine | Admitting: Pain Medicine

## 2015-03-23 VITALS — BP 138/61 | HR 63 | Temp 97.9°F | Resp 16 | Ht 68.0 in | Wt 220.0 lb

## 2015-03-23 DIAGNOSIS — Z981 Arthrodesis status: Secondary | ICD-10-CM | POA: Diagnosis not present

## 2015-03-23 DIAGNOSIS — M5136 Other intervertebral disc degeneration, lumbar region: Secondary | ICD-10-CM | POA: Insufficient documentation

## 2015-03-23 DIAGNOSIS — M79605 Pain in left leg: Secondary | ICD-10-CM | POA: Diagnosis present

## 2015-03-23 DIAGNOSIS — G43909 Migraine, unspecified, not intractable, without status migrainosus: Secondary | ICD-10-CM | POA: Insufficient documentation

## 2015-03-23 DIAGNOSIS — M5481 Occipital neuralgia: Secondary | ICD-10-CM | POA: Diagnosis not present

## 2015-03-23 DIAGNOSIS — M533 Sacrococcygeal disorders, not elsewhere classified: Secondary | ICD-10-CM

## 2015-03-23 DIAGNOSIS — M1990 Unspecified osteoarthritis, unspecified site: Secondary | ICD-10-CM | POA: Insufficient documentation

## 2015-03-23 DIAGNOSIS — M47816 Spondylosis without myelopathy or radiculopathy, lumbar region: Secondary | ICD-10-CM

## 2015-03-23 DIAGNOSIS — M51369 Other intervertebral disc degeneration, lumbar region without mention of lumbar back pain or lower extremity pain: Secondary | ICD-10-CM

## 2015-03-23 DIAGNOSIS — M5030 Other cervical disc degeneration, unspecified cervical region: Secondary | ICD-10-CM | POA: Diagnosis not present

## 2015-03-23 DIAGNOSIS — M79604 Pain in right leg: Secondary | ICD-10-CM | POA: Diagnosis present

## 2015-03-23 DIAGNOSIS — M545 Low back pain: Secondary | ICD-10-CM | POA: Diagnosis present

## 2015-03-23 MED ORDER — DICLOFENAC EPOLAMINE 1.3 % TD PTCH: MEDICATED_PATCH | TRANSDERMAL | Status: DC

## 2015-03-23 MED ORDER — HYDROCODONE-ACETAMINOPHEN 5-325 MG PO TABS: ORAL_TABLET | ORAL | Status: DC

## 2015-03-23 NOTE — Progress Notes (Signed)
Safety precautions to be maintained throughout the outpatient stay will include: orient to surroundings, keep bed in low position, maintain call bell within reach at all times, provide assistance with transfer out of bed and ambulation.  

## 2015-03-23 NOTE — Patient Instructions (Addendum)
PLAN   Continue present medications  hydrocodone acetaminophen and Flector patch. Taper gradually as discussed and discontinue Neurontin as discussed  F/U PCP L Owens  for evaliation of  BP and general medical  condition  F/U surgical evaluation. Follow-up with podiatrist as discussed for lower extremity swelling and for general evaluation  F/U neurological evaluation. May consider pending follow-up evaluations  May consider radiofrequency rhizolysis or intraspinal procedures pending response to present treatment and F/U evaluation   Patient to call Pain Management Center should patient have concerns prior to scheduled return appointment.

## 2015-03-23 NOTE — Progress Notes (Signed)
   Subjective:    Patient ID: Nancy Jacobson, female    DOB: 04/26/1961, 54 y.o.   MRN: [id number]  HPI Patient 54 year old female who returns to [facility] Center for further evaluation and treatment of pain involving the lower back lower extremity region with greater than 75% relief of pain following block of nerves to the sacroiliac joint. Patient states that she has some swelling of the foot. We discussed patient's condition and will schedule patient for reevaluation with the diagnosis at this time. Patient also wishes to decrease Neurontin and discontinue Neurontin. We discussed discontinuing Neurontin and patient will taper Neurontin gradually and discontinue Neurontin. There is concern regarding Neurontin which may be contributing to lower extremity swelling. We will defer patient's response to present treatment and consider additional modification of treatment regimen as well as additional studies pending response to treatment and follow-up evaluation. The patient was understanding and agreement with suggested treatment plan. Benefit from medication such as Cymbalta as well as discussed on today's visit.   Review of Systems     Objective:   Physical Exam There was mild tenderness of the splenius capitis and occipitalis musculature region.. Palpation of cervical facet cervical paraspinal musculature region was with mild tends to palpation. Was evidence of muscle spasm of the upper and mid thoracic region. Patient appeared to be with unremarkable Spurling's maneuver. There was mild tinnitus of the acromial clavicular and glenohumeral joint regions. There was tenderness over the thoracic facet thoracic paraspinal musculature region with no crepitus of the thoracic region noted. Palpation of the lumbar paraspinal musculature region lumbar facet region was with mild tenderness to palpation. Lateral bending and rotation and extension and palpation of the lumbar facets reproduce mild  discomfort. There was mild tennis over the PSIS PSIS region as well as the gluteal and piriformis musculature regions. There was minimal ptosis of the greater trochanteric region iliotibial band region. Straight leg raising tolerated to possible 30 without an increase of pain with dorsiflexion noted. There was negative clonus negative Homans. The foot was without increased warmth or erythema and was without areas of point tenderness. o sensory deficit of dermatomal distribution of the lower extremities was noted. There was negative clonus negative Homans and abdomen was nontender with no costovertebral maintenance noted.       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 Continue Flector patch. May consider Cymbalta or similar medications pending response to the discontinuation of Neurontin and to continuation of present medications  F/U PCP L  Owensfor evaliation of  BP and general medical  condition  F/U surgical evaluation. May consider pending follow-up evaluations  F/U neurological evaluation. May consider pending follow-up evaluations  F/U with podiatrist for evaluation of swelling of the foot as discussed  May consider radiofrequency rhizolysis or intraspinal procedures pending response to present treatment and F/U evaluation   Patient to call [facility] Management Center should patient have concerns prior to scheduled return appointment.

## 2015-04-13 ENCOUNTER — Telehealth: Payer: Self-pay

## 2015-04-13 NOTE — Telephone Encounter (Signed)
Pt wants a script for Neurontin. She said that she is in a lot of pain.

## 2015-04-13 NOTE — Telephone Encounter (Signed)
Patient wants Neurontin pt is in a lot of pain. She wants to know will Dr Metta Clinesrisp write her a prescription

## 2015-04-14 ENCOUNTER — Other Ambulatory Visit: Payer: Self-pay | Admitting: Pain Medicine

## 2015-04-14 MED ORDER — GABAPENTIN 100 MG PO CAPS: ORAL_CAPSULE | ORAL | Status: DC

## 2015-04-14 NOTE — Telephone Encounter (Signed)
Patient wants to start taking neurontin again. States Dr. Metta Clinesrisp and PCP recommended that she stop it due to  LE swelling (only on one side). Patient states that she stopped it for three weeks and the swelling has not improved. Patient states that she would like to resume taking it because it helped her with her pain and to sleep at night.   Dr. Metta Clinesrisp please advise.   Thank you. 100mg  2 to 3 times per day.

## 2015-04-14 NOTE — Telephone Encounter (Signed)
Nancy DikeJennifer and Nurses Neurontin 100 mg by mouth 2-3 times per day  quantity 80 was E prescribed Please call patient and inform her that prescription has been E prescribed to pharmacy Make sure the prescription went to the correct  pharmacy please

## 2015-04-22 ENCOUNTER — Ambulatory Visit: Payer: 59 | Attending: Pain Medicine | Admitting: Pain Medicine

## 2015-04-22 ENCOUNTER — Encounter: Payer: Self-pay | Admitting: Pain Medicine

## 2015-04-22 VITALS — BP 118/69 | HR 56 | Temp 97.7°F | Resp 16 | Ht 68.0 in | Wt 220.0 lb

## 2015-04-22 DIAGNOSIS — M542 Cervicalgia: Secondary | ICD-10-CM | POA: Diagnosis present

## 2015-04-22 DIAGNOSIS — M5481 Occipital neuralgia: Secondary | ICD-10-CM | POA: Insufficient documentation

## 2015-04-22 DIAGNOSIS — M545 Low back pain: Secondary | ICD-10-CM | POA: Diagnosis present

## 2015-04-22 DIAGNOSIS — M5030 Other cervical disc degeneration, unspecified cervical region: Secondary | ICD-10-CM | POA: Diagnosis not present

## 2015-04-22 DIAGNOSIS — Z981 Arthrodesis status: Secondary | ICD-10-CM | POA: Insufficient documentation

## 2015-04-22 DIAGNOSIS — G43909 Migraine, unspecified, not intractable, without status migrainosus: Secondary | ICD-10-CM | POA: Diagnosis not present

## 2015-04-22 DIAGNOSIS — M47816 Spondylosis without myelopathy or radiculopathy, lumbar region: Secondary | ICD-10-CM

## 2015-04-22 DIAGNOSIS — M5136 Other intervertebral disc degeneration, lumbar region: Secondary | ICD-10-CM | POA: Diagnosis not present

## 2015-04-22 DIAGNOSIS — M533 Sacrococcygeal disorders, not elsewhere classified: Secondary | ICD-10-CM | POA: Diagnosis not present

## 2015-04-22 MED ORDER — HYDROCODONE-ACETAMINOPHEN 5-325 MG PO TABS: ORAL_TABLET | ORAL | Status: DC

## 2015-04-22 NOTE — Patient Instructions (Addendum)
PLAN   Continue present medications  Neurontin hydrocodone acetaminophen and Flector patch.  Block of nerves to the sacroiliac joint to be performed at time of return appointment  F/U PCP [name]  for evaliation of  BP and general medical  condition  F/U surgical evaluation. Follow-up with podiatrist as discussed for lower extremity swelling and for general evaluation  F/U neurological evaluation. May consider pending follow-up evaluations  May consider radiofrequency rhizolysis or intraspinal procedures pending response to present treatment and F/U evaluation   Patient to call Pain Management Center should patient have concerns prior to scheduled return appointment.Pain Management Discharge Instructions  General Discharge Instructions :  If you need to reach your doctor call: Monday-Friday 8:00 am - 4:00 pm at [phone] or toll free [phone].  After clinic hours [phone] to have operator reach doctor.  Bring all of your medication bottles to all your appointments in the pain clinic.  To cancel or reschedule your appointment with Pain Management please remember to call 24 hours in advance to avoid a fee.  Refer to the educational materials which you have been given on: General Risks, I had my Procedure. Discharge Instructions, Post Sedation.  Post Procedure Instructions:  The drugs you were given will stay in your system until tomorrow, so for the next 24 hours you should not drive, make any legal decisions or drink any alcoholic beverages.  You may eat anything you prefer, but it is better to start with liquids then soups and crackers, and gradually work up to solid foods.  Please notify your doctor immediately if you have any unusual bleeding, trouble breathing or pain that is not related to your normal pain.  Depending on the type of procedure that was done, some parts of your body may feel week and/or numb.  This usually clears up by tonight or the next  day.  Walk with the use of an assistive device or accompanied by an adult for the 24 hours.  You may use ice on the affected area for the first 24 hours.  Put ice in a Ziploc bag and cover with a towel and place against area 15 minutes on 15 minutes off.  You may switch to heat after 24 hours.Sacroiliac (SI) Joint Injection Patient Information  Description: The sacroiliac joint connects the scrum (very low back and tailbone) to the ilium (a pelvic bone which also forms half of the hip joint).  Normally this joint experiences very little motion.  When this joint becomes inflamed or unstable low back and or hip and pelvis pain may result.  Injection of this joint with local anesthetics (numbing medicines) and steroids can provide diagnostic information and reduce pain.  This injection is performed with the aid of x-ray guidance into the tailbone area while you are lying on your stomach.   You may experience an electrical sensation down the leg while this is being done.  You may also experience numbness.  We also may ask if we are reproducing your normal pain during the injection.  Conditions which may be treated SI injection:   Low back, buttock, hip or leg pain  Preparation for the Injection:  1. Do not eat any solid food or dairy products within 6 hours of your appointment.  2. You may drink clear liquids up to 2 hours before appointment.  Clear liquids include water, black coffee, juice or soda.  No milk or cream please. 3. You may take your regular medications, including pain medications with a sip  of water before your appointment.  Diabetics should hold regular insulin (if take separately) and take 1/2 normal NPH dose the morning of the procedure.  Carry some sugar containing items with you to your appointment. 4. A driver must accompany you and be prepared to drive you home after your procedure. 5. Bring all of your current medications with you. 6. An IV may be inserted and sedation may be  given at the discretion of the physician. 7. A blood pressure cuff, EKG and other monitors will often be applied during the procedure.  Some patients may need to have extra oxygen administered for a short period.  8. You will be asked to provide medical information, including your allergies, prior to the procedure.  We must know immediately if you are taking blood thinners (like Coumadin/Warfarin) or if you are allergic to IV iodine contrast (dye).  We must know if you could possible be pregnant.  Possible side effects:   Bleeding from needle site  Infection (rare, may require surgery)  Nerve injury (rare)  Numbness & tingling (temporary)  A brief convulsion or seizure  Light-headedness (temporary)  Pain at injection site (several days)  Decreased blood pressure (temporary)  Weakness in the leg (temporary)   Call if you experience:   New onset weakness or numbness of an extremity below the injection site that last more than 8 hours.  Hives or difficulty breathing ( go to the emergency room)  Inflammation or drainage at the injection site  Any new symptoms which are concerning to you  Please note:  Although the local anesthetic injected can often make your back/ hip/ buttock/ leg feel good for several hours after the injections, the pain will likely return.  It takes 3-7 days for steroids to work in the sacroiliac area.  You may not notice any pain relief for at least that one week.  If effective, we will often do a series of three injections spaced 3-6 weeks apart to maximally decrease your pain.  After the initial series, we generally will wait some months before a repeat injection of the same type.  If you have any questions, please call [phone] [facility]  GENERAL RISKS AND COMPLICATIONS  What are the risk, side effects and possible complications? Generally speaking, most procedures are safe.  However, with any procedure  there are risks, side effects, and the possibility of complications.  The risks and complications are dependent upon the sites that are lesioned, or the type of nerve block to be performed.  The closer the procedure is to the spine, the more serious the risks are.  Great care is taken when placing the radio frequency needles, block needles or lesioning probes, but sometimes complications can occur. 1. Infection: Any time there is an injection through the skin, there is a risk of infection.  This is why sterile conditions are used for these blocks.  There are four possible types of infection. 1. Localized skin infection. 2. Central Nervous System Infection-This can be in the form of Meningitis, which can be deadly. 3. Epidural Infections-This can be in the form of an epidural abscess, which can cause pressure inside of the spine, causing compression of the spinal cord with subsequent paralysis. This would require an emergency surgery to decompress, and there are no guarantees that the patient would recover from the paralysis. 4. Discitis-This is an infection of the intervertebral discs.  It occurs in about 1% of discography procedures.  It is  difficult to treat and it may lead to surgery.        2. Pain: the needles have to go through skin and soft tissues, will cause soreness.       3. Damage to internal structures:  The nerves to be lesioned may be near blood vessels or    other nerves which can be potentially damaged.       4. Bleeding: Bleeding is more common if the patient is taking blood thinners such as  aspirin, Coumadin, Ticiid, Plavix, etc., or if he/she have some genetic predisposition  such as hemophilia. Bleeding into the spinal canal can cause compression of the spinal  cord with subsequent paralysis.  This would require an emergency surgery to  decompress and there are no guarantees that the patient would recover from the  paralysis.       5. Pneumothorax:  Puncturing of a lung is a  possibility, every time a needle is introduced in  the area of the chest or upper back.  Pneumothorax refers to free air around the  collapsed lung(s), inside of the thoracic cavity (chest cavity).  Another two possible  complications related to a similar event would include: Hemothorax and Chylothorax.   These are variations of the Pneumothorax, where instead of air around the collapsed  lung(s), you may have blood or chyle, respectively.       6. Spinal headaches: They may occur with any procedures in the area of the spine.       7. Persistent CSF (Cerebro-Spinal Fluid) leakage: This is a rare problem, but may occur  with prolonged intrathecal or epidural catheters either due to the formation of a fistulous  track or a dural tear.       8. Nerve damage: By working so close to the spinal cord, there is always a possibility of  nerve damage, which could be as serious as a permanent spinal cord injury with  paralysis.       9. Death:  Although rare, severe deadly allergic reactions known as "Anaphylactic  reaction" can occur to any of the medications used.      10. Worsening of the symptoms:  We can always make thing worse.  What are the chances of something like this happening? Chances of any of this occuring are extremely low.  By statistics, you have more of a chance of getting killed in a motor vehicle accident: while driving to the hospital than any of the above occurring .  Nevertheless, you should be aware that they are possibilities.  In general, it is similar to taking a shower.  Everybody knows that you can slip, hit your head and get killed.  Does that mean that you should not shower again?  Nevertheless always keep in mind that statistics do not mean anything if you happen to be on the wrong side of them.  Even if a procedure has a 1 (one) in a 1,000,000 (million) chance of going wrong, it you happen to be that one..Also, keep in mind that by statistics, you have more of a chance of having  something go wrong when taking medications.  Who should not have this procedure? If you are on a blood thinning medication (e.g. Coumadin, Plavix, see list of "Blood Thinners"), or if you have an active infection going on, you should not have the procedure.  If you are taking any blood thinners, please inform your physician.  How should I prepare for this procedure?  Do not  eat or drink anything at least six hours prior to the procedure.  Bring a driver with you .  It cannot be a taxi.  Come accompanied by an adult that can drive you back, and that is strong enough to help you if your legs get weak or numb from the local anesthetic.  Take all of your medicines the morning of the procedure with just enough water to swallow them.  If you have diabetes, make sure that you are scheduled to have your procedure done first thing in the morning, whenever possible.  If you have diabetes, take only half of your insulin dose and notify our nurse that you have done so as soon as you arrive at the clinic.  If you are diabetic, but only take blood sugar pills (oral hypoglycemic), then do not take them on the morning of your procedure.  You may take them after you have had the procedure.  Do not take aspirin or any aspirin-containing medications, at least eleven (11) days prior to the procedure.  They may prolong bleeding.  Wear loose fitting clothing that may be easy to take off and that you would not mind if it got stained with Betadine or blood.  Do not wear any jewelry or perfume  Remove any nail coloring.  It will interfere with some of our monitoring equipment.  NOTE: Remember that this is not meant to be interpreted as a complete list of all possible complications.  Unforeseen problems may occur.  BLOOD THINNERS The following drugs contain aspirin or other products, which can cause increased bleeding during surgery and should not be taken for 2 weeks prior to and 1 week after surgery.  If you  should need take something for relief of minor pain, you may take acetaminophen which is found in Tylenol,m Datril, Anacin-3 and Panadol. It is not blood thinner. The products listed below are.  Do not take any of the products listed below in addition to any listed on your instruction sheet.  A.P.C or A.P.C with Codeine Codeine Phosphate Capsules #3 Ibuprofen Ridaura  ABC compound Congesprin Imuran rimadil  Advil Cope Indocin Robaxisal  Alka-Seltzer Effervescent Pain Reliever and Antacid Coricidin or Coricidin-D  Indomethacin Rufen  Alka-Seltzer plus Cold Medicine Cosprin Ketoprofen S-A-C Tablets  Anacin Analgesic Tablets or Capsules Coumadin Korlgesic Salflex  Anacin Extra Strength Analgesic tablets or capsules CP-2 Tablets Lanoril Salicylate  Anaprox Cuprimine Capsules Levenox Salocol  Anexsia-D Dalteparin Magan Salsalate  Anodynos Darvon compound Magnesium Salicylate Sine-off  Ansaid Dasin Capsules Magsal Sodium Salicylate  Anturane Depen Capsules Marnal Soma  APF Arthritis pain formula Dewitt's Pills Measurin Stanback  Argesic Dia-Gesic Meclofenamic Sulfinpyrazone  Arthritis Bayer Timed Release Aspirin Diclofenac Meclomen Sulindac  Arthritis pain formula Anacin Dicumarol Medipren Supac  Analgesic (Safety coated) Arthralgen Diffunasal Mefanamic Suprofen  Arthritis Strength Bufferin Dihydrocodeine Mepro Compound Suprol  Arthropan liquid Dopirydamole Methcarbomol with Aspirin Synalgos  ASA tablets/Enseals Disalcid Micrainin Tagament  Ascriptin Doan's Midol Talwin  Ascriptin A/D Dolene Mobidin Tanderil  Ascriptin Extra Strength Dolobid Moblgesic Ticlid  Ascriptin with Codeine Doloprin or Doloprin with Codeine Momentum Tolectin  Asperbuf Duoprin Mono-gesic Trendar  Aspergum Duradyne Motrin or Motrin IB Triminicin  Aspirin plain, buffered or enteric coated Durasal Myochrisine Trigesic  Aspirin Suppositories Easprin Nalfon Trillsate  Aspirin with Codeine Ecotrin Regular or Extra Strength  Naprosyn Uracel  Atromid-S Efficin Naproxen Ursinus  Auranofin Capsules Elmiron Neocylate Vanquish  Axotal Emagrin Norgesic Verin  Azathioprine Empirin or Empirin with Codeine Normiflo Vitamin E  Azolid  Emprazil Nuprin Voltaren  Bayer Aspirin plain, buffered or children's or timed BC Tablets or powders Encaprin Orgaran Warfarin Sodium  Buff-a-Comp Enoxaparin Orudis Zorpin  Buff-a-Comp with Codeine Equegesic Os-Cal-Gesic   Buffaprin Excedrin plain, buffered or Extra Strength Oxalid   Bufferin Arthritis Strength Feldene Oxphenbutazone   Bufferin plain or Extra Strength Feldene Capsules Oxycodone with Aspirin   Bufferin with Codeine Fenoprofen Fenoprofen Pabalate or Pabalate-SF   Buffets II Flogesic Panagesic   Buffinol plain or Extra Strength Florinal or Florinal with Codeine Panwarfarin   Buf-Tabs Flurbiprofen Penicillamine   Butalbital Compound Four-way cold tablets Penicillin   Butazolidin Fragmin Pepto-Bismol   Carbenicillin Geminisyn Percodan   Carna Arthritis Reliever Geopen Persantine   Carprofen Gold's salt Persistin   Chloramphenicol Goody's Phenylbutazone   Chloromycetin Haltrain Piroxlcam   Clmetidine heparin Plaquenil   Cllnoril Hyco-pap Ponstel   Clofibrate Hydroxy chloroquine Propoxyphen         Before stopping any of these medications, be sure to consult the physician who ordered them.  Some, such as Coumadin (Warfarin) are ordered to prevent or treat serious conditions such as "deep thrombosis", "pumonary embolisms", and other heart problems.  The amount of time that you may need off of the medication may also vary with the medication and the reason for which you were taking it.  If you are taking any of these medications, please make sure you notify your pain physician before you undergo any procedures.

## 2015-04-22 NOTE — Progress Notes (Signed)
Safety precautions to be maintained throughout the outpatient stay will include: orient to surroundings, keep bed in low position, maintain call bell within reach at all times, provide assistance with transfer out of bed and ambulation.  

## 2015-04-22 NOTE — Progress Notes (Signed)
   Subjective:    Patient ID: Nancy MoorsBerlinda Lunsford Duplantis, female    DOB: 07/05/1960, 54 y.o.   MRN: [id number]  HPI    The patient is a 54 year old female who returns to pain management Center for further evaluation and treatment of pain involving the neck upper extremity regions as well as the lower back and lower extremity regions with pain occurring across the region of the buttocks on the left as well as on the right which is aggravated by standing walking twisting turning maneuvers as well as climbing stairs. Patient denies any recent trauma change in events of daily living cause change in symptomatology. At the present time we will continue patient's Neurontin and hydrocodone acetaminophen and Flector patches. The patient is with return of significant pain which patient states is interfering involving activities of daily living. Patient missed a significant improvement of pain with prior block of nerves to the sacroiliac joint which we will perform at time return appointment as discussed with patient who was with understanding and agreed to suggested treatment plan.  Review of Systems     Objective:   Physical Exam  There was tenderness to palpation of paraspinal machine cervical and cervical facet region of mild degree with well-healed surgical scar of the cervical region without increased warmth and erythema in the region scar. Patient appeared to be unremarkable Spurling's maneuver and was with bilaterally equal grip strength with Tinel and Phalen's maneuver reproducing minimal discomfort. There was tends to palpation over the region thoracic facet thoracic paraspinal musculature region with no crepitus of the thoracic region noted. Palpation over the region of the lumbar paraspinal musculatures and lumbar facet region was resistance to palpation of mild to moderate degree with lateral bending and rotation extension and palpation of the lumbar facets reproduced mild to moderate discomfort.  Straight leg raising appeared to be limited to approximately 30 without increased pain with dorsiflexion noted. There was negative clonus negative Homans no definite sensory deficit or dermatomal distribution detected DTRs appeared to be trace at the knees there was moderate to moderately severe tenderness to palpation of the greater trochanteric region and iliotibial band region there was severe tenderness to palpation of the PSIS and PII S regions and patient was with increase of pain of severe degree with Patrick's maneuver. There was negative clonus negative Homans.. Abdomen nontender with no costovertebral tenderness noted.      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  Block of nerves to the sacroiliac joint to be performed at time of return appointment  F/U PCP L Barry Dieneswens  for evaliation of  BP and general medical  condition  F/U surgical evaluation. Follow-up with podiatrist as discussed for lower extremity swelling and for general evaluation  F/U neurological evaluation. May consider pending follow-up evaluations  May consider radiofrequency rhizolysis or intraspinal procedures pending response to present treatment and F/U evaluation   Patient to call Pain Management Center should patient have concerns prior to scheduled return appointment

## 2015-05-10 ENCOUNTER — Telehealth: Payer: Self-pay | Admitting: Pain Medicine

## 2015-05-10 ENCOUNTER — Ambulatory Visit: Payer: 59 | Admitting: Pain Medicine

## 2015-05-10 NOTE — Telephone Encounter (Signed)
Patient has family emergency cannot come for appt today / resched to 05-19-15

## 2015-05-10 NOTE — Telephone Encounter (Signed)
Nancy Jacobson spoke with the patient Please schedule patient for Monday, 05/24/2015 for  7:30 AM appointment. Patient has already put this date on her calendar for block of nerves to the sacroiliac joint

## 2015-05-19 ENCOUNTER — Ambulatory Visit: Payer: 59 | Attending: Pain Medicine | Admitting: Pain Medicine

## 2015-05-19 ENCOUNTER — Encounter: Payer: Self-pay | Admitting: Pain Medicine

## 2015-05-19 VITALS — BP 112/61 | HR 52 | Temp 97.9°F | Resp 18 | Ht 68.0 in | Wt 220.0 lb

## 2015-05-19 DIAGNOSIS — M5136 Other intervertebral disc degeneration, lumbar region: Secondary | ICD-10-CM

## 2015-05-19 DIAGNOSIS — M545 Low back pain: Secondary | ICD-10-CM | POA: Diagnosis present

## 2015-05-19 DIAGNOSIS — M47816 Spondylosis without myelopathy or radiculopathy, lumbar region: Secondary | ICD-10-CM | POA: Diagnosis not present

## 2015-05-19 DIAGNOSIS — Z981 Arthrodesis status: Secondary | ICD-10-CM

## 2015-05-19 DIAGNOSIS — M5481 Occipital neuralgia: Secondary | ICD-10-CM

## 2015-05-19 DIAGNOSIS — M5030 Other cervical disc degeneration, unspecified cervical region: Secondary | ICD-10-CM

## 2015-05-19 DIAGNOSIS — M533 Sacrococcygeal disorders, not elsewhere classified: Secondary | ICD-10-CM

## 2015-05-19 DIAGNOSIS — M51369 Other intervertebral disc degeneration, lumbar region without mention of lumbar back pain or lower extremity pain: Secondary | ICD-10-CM

## 2015-05-19 MED ORDER — TRIAMCINOLONE ACETONIDE 40 MG/ML IJ SUSP
40.0000 mg | Freq: Once | INTRAMUSCULAR | Status: AC
Start: 2015-05-19 — End: 2015-05-19
  Administered 2015-05-19: 12:00:00

## 2015-05-19 MED ORDER — FENTANYL CITRATE (PF) 100 MCG/2ML IJ SOLN
INTRAMUSCULAR | Status: AC
  Administered 2015-05-19: 100 ug via INTRAVENOUS
  Filled 2015-05-19: qty 2

## 2015-05-19 MED ORDER — ORPHENADRINE CITRATE 30 MG/ML IJ SOLN
60.0000 mg | Freq: Once | INTRAMUSCULAR | Status: AC
  Administered 2015-05-19: 12:00:00 via INTRAMUSCULAR

## 2015-05-19 MED ORDER — TRIAMCINOLONE ACETONIDE 40 MG/ML IJ SUSP
INTRAMUSCULAR | Status: AC
  Filled 2015-05-19: qty 1

## 2015-05-19 MED ORDER — CIPROFLOXACIN IN D5W 400 MG/200ML IV SOLN
INTRAVENOUS | Status: AC
  Administered 2015-05-19: 400 mg via INTRAVENOUS
  Filled 2015-05-19: qty 200

## 2015-05-19 MED ORDER — CIPROFLOXACIN IN D5W 400 MG/200ML IV SOLN
400.0000 mg | Freq: Once | INTRAVENOUS | Status: AC
  Administered 2015-05-19: 400 mg via INTRAVENOUS

## 2015-05-19 MED ORDER — BUPIVACAINE HCL (PF) 0.25 % IJ SOLN
INTRAMUSCULAR | Status: AC
Start: 2015-05-19 — End: 2015-05-19
  Filled 2015-05-19: qty 30

## 2015-05-19 MED ORDER — ORPHENADRINE CITRATE 30 MG/ML IJ SOLN
INTRAMUSCULAR | Status: AC
  Filled 2015-05-19: qty 2

## 2015-05-19 MED ORDER — MIDAZOLAM HCL 5 MG/5ML IJ SOLN
INTRAMUSCULAR | Status: AC
  Administered 2015-05-19: 3 mg via INTRAVENOUS
  Filled 2015-05-19: qty 5

## 2015-05-19 MED ORDER — CIPROFLOXACIN HCL 250 MG PO TABS: 250.0000 mg | ORAL_TABLET | Freq: Two times a day (BID) | ORAL | Status: DC

## 2015-05-19 MED ORDER — HYDROCODONE-ACETAMINOPHEN 5-325 MG PO TABS: ORAL_TABLET | ORAL | Status: DC

## 2015-05-19 MED ORDER — DICLOFENAC EPOLAMINE 1.3 % TD PTCH: MEDICATED_PATCH | TRANSDERMAL | Status: DC

## 2015-05-19 MED ORDER — MIDAZOLAM HCL 5 MG/5ML IJ SOLN
5.0000 mg | Freq: Once | INTRAMUSCULAR | Status: AC
  Administered 2015-05-19: 3 mg via INTRAVENOUS

## 2015-05-19 MED ORDER — BUPIVACAINE HCL (PF) 0.25 % IJ SOLN
30.0000 mL | Freq: Once | INTRAMUSCULAR | Status: AC
  Administered 2015-05-19: 12:00:00

## 2015-05-19 MED ORDER — GABAPENTIN 100 MG PO CAPS: ORAL_CAPSULE | ORAL | Status: DC

## 2015-05-19 MED ORDER — FENTANYL CITRATE (PF) 100 MCG/2ML IJ SOLN
100.0000 ug | Freq: Once | INTRAMUSCULAR | Status: AC
  Administered 2015-05-19: 100 ug via INTRAVENOUS

## 2015-05-19 MED ORDER — LACTATED RINGERS IV SOLN: 1000.0000 mL | INTRAVENOUS | Status: DC

## 2015-05-19 NOTE — Patient Instructions (Addendum)
PLAN   Continue present medications  Neurontin hydrocodone acetaminophen and Flector patch. Begin Cipro antibiotic today as prescribed  F/U PCP L Owens  for evaliation of  BP and general medical  condition  F/U surgical evaluation. Follow-up with podiatrist as discussed for lower extremity swelling and for general evaluation  F/U neurological evaluation. May consider pending follow-up evaluations  May consider radiofrequency rhizolysis or intraspinal procedures pending response to present treatment and F/U evaluation   Patient to call [facility] Management Center should patient have concerns prior to scheduled return appointment.Pain Management Discharge Instructions  General Discharge Instructions :  If you need to reach your doctor call: Monday-Friday 8:00 am - 4:00 pm at [phone] or toll free [phone].  After clinic hours [phone] to have operator reach doctor.  Bring all of your medication bottles to all your appointments in the pain clinic.  To cancel or reschedule your appointment with Pain Management please remember to call 24 hours in advance to avoid a fee.  Refer to the educational materials which you have been given on: General Risks, I had my Procedure. Discharge Instructions, Post Sedation.  Post Procedure Instructions:  The drugs you were given will stay in your system until tomorrow, so for the next 24 hours you should not drive, make any legal decisions or drink any alcoholic beverages.  You may eat anything you prefer, but it is better to start with liquids then soups and crackers, and gradually work up to solid foods.  Please notify your doctor immediately if you have any unusual bleeding, trouble breathing or pain that is not related to your normal pain.  Depending on the type of procedure that was done, some parts of your body may feel week and/or numb.  This usually clears up by tonight or the next day.  Walk with the use of an assistive device or  accompanied by an adult for the 24 hours.  You may use ice on the affected area for the first 24 hours.  Put ice in a Ziploc bag and cover with a towel and place against area 15 minutes on 15 minutes off.  You may switch to heat after 24 hours.GENERAL RISKS AND COMPLICATIONS  What are the risk, side effects and possible complications? Generally speaking, most procedures are safe.  However, with any procedure there are risks, side effects, and the possibility of complications.  The risks and complications are dependent upon the sites that are lesioned, or the type of nerve block to be performed.  The closer the procedure is to the spine, the more serious the risks are.  Great care is taken when placing the radio frequency needles, block needles or lesioning probes, but sometimes complications can occur. 1. Infection: Any time there is an injection through the skin, there is a risk of infection.  This is why sterile conditions are used for these blocks.  There are four possible types of infection. 1. Localized skin infection. 2. Central Nervous System Infection-This can be in the form of Meningitis, which can be deadly. 3. Epidural Infections-This can be in the form of an epidural abscess, which can cause pressure inside of the spine, causing compression of the spinal cord with subsequent paralysis. This would require an emergency surgery to decompress, and there are no guarantees that the patient would recover from the paralysis. 4. Discitis-This is an infection of the intervertebral discs.  It occurs in about 1% of discography procedures.  It is difficult to treat and it may lead to  surgery.        2. Pain: the needles have to go through skin and soft tissues, will cause soreness.       3. Damage to internal structures:  The nerves to be lesioned may be near blood vessels or    other nerves which can be potentially damaged.       4. Bleeding: Bleeding is more common if the patient is taking blood  thinners such as  aspirin, Coumadin, Ticiid, Plavix, etc., or if he/she have some genetic predisposition  such as hemophilia. Bleeding into the spinal canal can cause compression of the spinal  cord with subsequent paralysis.  This would require an emergency surgery to  decompress and there are no guarantees that the patient would recover from the  paralysis.       5. Pneumothorax:  Puncturing of a lung is a possibility, every time a needle is introduced in  the area of the chest or upper back.  Pneumothorax refers to free air around the  collapsed lung(s), inside of the thoracic cavity (chest cavity).  Another two possible  complications related to a similar event would include: Hemothorax and Chylothorax.   These are variations of the Pneumothorax, where instead of air around the collapsed  lung(s), you may have blood or chyle, respectively.       6. Spinal headaches: They may occur with any procedures in the area of the spine.       7. Persistent CSF (Cerebro-Spinal Fluid) leakage: This is a rare problem, but may occur  with prolonged intrathecal or epidural catheters either due to the formation of a fistulous  track or a dural tear.       8. Nerve damage: By working so close to the spinal cord, there is always a possibility of  nerve damage, which could be as serious as a permanent spinal cord injury with  paralysis.       9. Death:  Although rare, severe deadly allergic reactions known as "Anaphylactic  reaction" can occur to any of the medications used.      10. Worsening of the symptoms:  We can always make thing worse.  What are the chances of something like this happening? Chances of any of this occuring are extremely low.  By statistics, you have more of a chance of getting killed in a motor vehicle accident: while driving to the hospital than any of the above occurring .  Nevertheless, you should be aware that they are possibilities.  In general, it is similar to taking a shower.  Everybody knows  that you can slip, hit your head and get killed.  Does that mean that you should not shower again?  Nevertheless always keep in mind that statistics do not mean anything if you happen to be on the wrong side of them.  Even if a procedure has a 1 (one) in a 1,000,000 (million) chance of going wrong, it you happen to be that one..Also, keep in mind that by statistics, you have more of a chance of having something go wrong when taking medications.  Who should not have this procedure? If you are on a blood thinning medication (e.g. Coumadin, Plavix, see list of "Blood Thinners"), or if you have an active infection going on, you should not have the procedure.  If you are taking any blood thinners, please inform your physician.  How should I prepare for this procedure?  Do not eat or drink anything at least six hours  prior to the procedure.  Bring a driver with you .  It cannot be a taxi.  Come accompanied by an adult that can drive you back, and that is strong enough to help you if your legs get weak or numb from the local anesthetic.  Take all of your medicines the morning of the procedure with just enough water to swallow them.  If you have diabetes, make sure that you are scheduled to have your procedure done first thing in the morning, whenever possible.  If you have diabetes, take only half of your insulin dose and notify our nurse that you have done so as soon as you arrive at the clinic.  If you are diabetic, but only take blood sugar pills (oral hypoglycemic), then do not take them on the morning of your procedure.  You may take them after you have had the procedure.  Do not take aspirin or any aspirin-containing medications, at least eleven (11) days prior to the procedure.  They may prolong bleeding.  Wear loose fitting clothing that may be easy to take off and that you would not mind if it got stained with Betadine or blood.  Do not wear any jewelry or perfume  Remove any nail  coloring.  It will interfere with some of our monitoring equipment.  NOTE: Remember that this is not meant to be interpreted as a complete list of all possible complications.  Unforeseen problems may occur.  BLOOD THINNERS The following drugs contain aspirin or other products, which can cause increased bleeding during surgery and should not be taken for 2 weeks prior to and 1 week after surgery.  If you should need take something for relief of minor pain, you may take acetaminophen which is found in Tylenol,m Datril, Anacin-3 and Panadol. It is not blood thinner. The products listed below are.  Do not take any of the products listed below in addition to any listed on your instruction sheet.  A.P.C or A.P.C with Codeine Codeine Phosphate Capsules #3 Ibuprofen Ridaura  ABC compound Congesprin Imuran rimadil  Advil Cope Indocin Robaxisal  Alka-Seltzer Effervescent Pain Reliever and Antacid Coricidin or Coricidin-D  Indomethacin Rufen  Alka-Seltzer plus Cold Medicine Cosprin Ketoprofen S-A-C Tablets  Anacin Analgesic Tablets or Capsules Coumadin Korlgesic Salflex  Anacin Extra Strength Analgesic tablets or capsules CP-2 Tablets Lanoril Salicylate  Anaprox Cuprimine Capsules Levenox Salocol  Anexsia-D Dalteparin Magan Salsalate  Anodynos Darvon compound Magnesium Salicylate Sine-off  Ansaid Dasin Capsules Magsal Sodium Salicylate  Anturane Depen Capsules Marnal Soma  APF Arthritis pain formula Dewitt's Pills Measurin Stanback  Argesic Dia-Gesic Meclofenamic Sulfinpyrazone  Arthritis Bayer Timed Release Aspirin Diclofenac Meclomen Sulindac  Arthritis pain formula Anacin Dicumarol Medipren Supac  Analgesic (Safety coated) Arthralgen Diffunasal Mefanamic Suprofen  Arthritis Strength Bufferin Dihydrocodeine Mepro Compound Suprol  Arthropan liquid Dopirydamole Methcarbomol with Aspirin Synalgos  ASA tablets/Enseals Disalcid Micrainin Tagament  Ascriptin Doan's Midol Talwin  Ascriptin A/D Dolene  Mobidin Tanderil  Ascriptin Extra Strength Dolobid Moblgesic Ticlid  Ascriptin with Codeine Doloprin or Doloprin with Codeine Momentum Tolectin  Asperbuf Duoprin Mono-gesic Trendar  Aspergum Duradyne Motrin or Motrin IB Triminicin  Aspirin plain, buffered or enteric coated Durasal Myochrisine Trigesic  Aspirin Suppositories Easprin Nalfon Trillsate  Aspirin with Codeine Ecotrin Regular or Extra Strength Naprosyn Uracel  Atromid-S Efficin Naproxen Ursinus  Auranofin Capsules Elmiron Neocylate Vanquish  Axotal Emagrin Norgesic Verin  Azathioprine Empirin or Empirin with Codeine Normiflo Vitamin E  Azolid Emprazil Nuprin Voltaren  Bayer Aspirin plain, buffered  or children's or timed BC Tablets or powders Encaprin Orgaran Warfarin Sodium  Buff-a-Comp Enoxaparin Orudis Zorpin  Buff-a-Comp with Codeine Equegesic Os-Cal-Gesic   Buffaprin Excedrin plain, buffered or Extra Strength Oxalid   Bufferin Arthritis Strength Feldene Oxphenbutazone   Bufferin plain or Extra Strength Feldene Capsules Oxycodone with Aspirin   Bufferin with Codeine Fenoprofen Fenoprofen Pabalate or Pabalate-SF   Buffets II Flogesic Panagesic   Buffinol plain or Extra Strength Florinal or Florinal with Codeine Panwarfarin   Buf-Tabs Flurbiprofen Penicillamine   Butalbital Compound Four-way cold tablets Penicillin   Butazolidin Fragmin Pepto-Bismol   Carbenicillin Geminisyn Percodan   Carna Arthritis Reliever Geopen Persantine   Carprofen Gold's salt Persistin   Chloramphenicol Goody's Phenylbutazone   Chloromycetin Haltrain Piroxlcam   Clmetidine heparin Plaquenil   Cllnoril Hyco-pap Ponstel   Clofibrate Hydroxy chloroquine Propoxyphen         Before stopping any of these medications, be sure to consult the physician who ordered them.  Some, such as Coumadin (Warfarin) are ordered to prevent or treat serious conditions such as "deep thrombosis", "pumonary embolisms", and other heart problems.  The amount of time that  you may need off of the medication may also vary with the medication and the reason for which you were taking it.  If you are taking any of these medications, please make sure you notify your pain physician before you undergo any procedures.  Hydrocodone prescription given

## 2015-05-19 NOTE — Progress Notes (Signed)
Safety precautions to be maintained throughout the outpatient stay will include: orient to surroundings, keep bed in low position, maintain call bell within reach at all times, provide assistance with transfer out of bed and ambulation.  

## 2015-05-19 NOTE — Progress Notes (Signed)
Subjective:    Patient ID: Nancy MoorsBerlinda Lunsford Mikhail, female    DOB: 04/20/1961, 54 y.o.   MRN: [id number]  HPI  PROCEDURE:  Block of nerves to the sacroiliac joint.   NOTE:  The patient is a 54 y.o. female who returns to the [facility] Management Center for further evaluation and treatment of pain involving the lower back and lower extremity region with pain in the region of the buttocks as well. Prior MRI studies reveal degenerative changes of the lumbar spine with post surgical changes due to prior lumbar fusion. The patient is with severe tenderness to palpation of the PSIS and PII S regions and is with positive Patrick's maneuver .   There is concern regarding a significant component of the patient's pain being due to sacroiliac joint dysfunction The risks, benefits, expectations of the procedure have been discussed and explained to the patient who is understanding and willing to proceed with interventional treatment in attempt to decrease severity of patient's symptoms, minimize the risk of medication escalation and  hopefully retard the progression of the patient's symptoms. We will proceed with what is felt to be a medically necessary procedure, block of nerves to the sacroiliac joint.   DESCRIPTION OF PROCEDURE:  Block of nerves to the sacroiliac joint.   The patient was taken to the fluoroscopy suite. With the patient in the prone position with EKG, blood pressure, pulse and pulse oximetry monitoring, IV Versed, IV fentanyl conscious sedation, Betadine prep of proposed entry site was performed.   Block of nerves at the L5 vertebral body level.   With the patient in prone position, under fluoroscopic guidance, a 22 -gauge needle was inserted at the L5 vertebral body level on the left side. With 15 degrees oblique orientation a 22 -gauge needle was inserted in the region known as Burton's eye or eye of the Scotty dog. Following documentation of needle placement in the area of Burton's eye or eye  of the Scotty dog under fluoroscopic guidance, needle placement was then accomplished at the sacral ala level on the left side.   Needle placement at the sacral ala.   With the patient in prone position under fluoroscopic guidance with AP view of the lumbosacral spine, a 22 -gauge needle was inserted in the region known as the sacral ala on the left side. Following documentation of needle placement on the left side under fluoroscopic guidance needle placement was then accomplished at the S1 foramen level.   Needle placement at the S1 foramen level.   With the patient in prone position under fluoroscopic guidance with AP view of the lumbosacral spine and cephalad orientation, a 22 -gauge needle was inserted at the superior and lateral border of the S1 foramen on the left side. Following documentation of needle placement at the S1 foramen level on the left side, needle placement was then accomplished at the S2 foramen level on the left side.   Needle placement at the S2 foramen level.   With the patient in prone position with AP view of the lumbosacral spine with cephalad orientation, a 22 - gauge needle was inserted at the superior and lateral border of the S2 foramen under fluoroscopic guidance on the left side. Following needle placement at the L5 vertebral body level, sacral ala, S1 foramen and S2 foramen on the left side, needle placement was verified on lateral view under fluoroscopic guidance.  Following needle placement documentation on lateral view, each needle was injected with 1 mL of 0.25% bupivacaine and  Kenalog.   BLOCK OF THE NERVES TO SACROILIAC JOINT ON THE RIGHT SIDE The procedure was performed on the right side at the same levels as was performed on the left side and utilizing the same technique as on the left side and was performed under fluoroscopic guidance as on the left side   A total of  of Kenalog was utilized for the procedure.   PLAN:  1. Medications: The patient  will continue presently prescribed medications. Neurontin hydrocodone acetaminophen and Flector patch 2. The patient will be considered for modification of treatment regimen pending response to the procedure performed on today's visit.  3. The patient is to follow-up with primary care physician Bud Face for evaluation of blood pressure and general medical condition following the procedure performed on today's visit.  4. Surgical evaluation as discussed. Has been addressed  5. Neurological evaluation as discussed. May consider [facility] EMG studies and other studies 6. The patient may be a candidate for radiofrequency procedures, implantation devices and other treatment pending response to treatment performed on today's visit and follow-up evaluation.  7. The patient has been advised to adhere to proper body mechanics and to avoid activities which may exacerbate the patient's symptoms.   Return appointment to Pain Management Center as scheduled.    Review of Systems     Objective:   Physical Exam        Assessment & Plan:

## 2015-05-20 ENCOUNTER — Telehealth: Payer: Self-pay | Admitting: *Deleted

## 2015-05-20 ENCOUNTER — Encounter: Payer: 59 | Admitting: Pain Medicine

## 2015-05-20 NOTE — Telephone Encounter (Signed)
Spoke with patient re; procedure on yesterday, verbalizes no problems or concerns.  

## 2015-06-16 ENCOUNTER — Encounter: Payer: Self-pay | Admitting: Pain Medicine

## 2015-06-16 ENCOUNTER — Ambulatory Visit: Payer: BLUE CROSS/BLUE SHIELD | Attending: Pain Medicine | Admitting: Pain Medicine

## 2015-06-16 VITALS — BP 124/70 | HR 65 | Temp 97.5°F | Resp 15 | Ht 68.0 in | Wt 213.0 lb

## 2015-06-16 DIAGNOSIS — M79606 Pain in leg, unspecified: Secondary | ICD-10-CM | POA: Diagnosis present

## 2015-06-16 DIAGNOSIS — M533 Sacrococcygeal disorders, not elsewhere classified: Secondary | ICD-10-CM | POA: Insufficient documentation

## 2015-06-16 DIAGNOSIS — M47816 Spondylosis without myelopathy or radiculopathy, lumbar region: Secondary | ICD-10-CM

## 2015-06-16 DIAGNOSIS — G43909 Migraine, unspecified, not intractable, without status migrainosus: Secondary | ICD-10-CM | POA: Diagnosis not present

## 2015-06-16 DIAGNOSIS — Z981 Arthrodesis status: Secondary | ICD-10-CM | POA: Insufficient documentation

## 2015-06-16 DIAGNOSIS — M5481 Occipital neuralgia: Secondary | ICD-10-CM | POA: Insufficient documentation

## 2015-06-16 DIAGNOSIS — M542 Cervicalgia: Secondary | ICD-10-CM | POA: Diagnosis present

## 2015-06-16 DIAGNOSIS — M5136 Other intervertebral disc degeneration, lumbar region: Secondary | ICD-10-CM | POA: Insufficient documentation

## 2015-06-16 DIAGNOSIS — M5030 Other cervical disc degeneration, unspecified cervical region: Secondary | ICD-10-CM

## 2015-06-16 DIAGNOSIS — M545 Low back pain: Secondary | ICD-10-CM | POA: Diagnosis present

## 2015-06-16 MED ORDER — GABAPENTIN 100 MG PO CAPS: ORAL_CAPSULE | ORAL | Status: DC

## 2015-06-16 MED ORDER — HYDROCODONE-ACETAMINOPHEN 5-325 MG PO TABS: ORAL_TABLET | ORAL | Status: DC

## 2015-06-16 NOTE — Progress Notes (Signed)
Safety precautions to be maintained throughout the outpatient stay will include: orient to surroundings, keep bed in low position, maintain call bell within reach at all times, provide assistance with transfer out of bed and ambulation.  

## 2015-06-16 NOTE — Progress Notes (Signed)
   Subjective:    Patient ID: Nancy MoorsBerlinda Lunsford Beller, female    DOB: 06/09/1960, 55 y.o.   MRN: [id number]  HPI  The patient is a 55 year old female who returns to pain management for further evaluation and treatment of pain involving the neck upper extremity regions mid and lower back and lower extremity regions. The patient continues to benefit from previous procedure consisting of block of nerves to the sacroiliac joint. The patient continues medications consisting of Neurontin hydrocodone acetaminophen and Flector patch. The patient states that she is able to perform activities of daily living as well as obtaining restful sleep without significant pain interfering with any of activities. We discussed patient's condition and will continue medications as prescribed. The patient is to call pain management should they be change in condition prior to scheduled return appointment. Patient agrees with suggested treatment plan      Review of Systems     Objective:   Physical Exam  There was tenderness of the splenius capitis and occipitalis musculature regions of minimal degree. There was minimal tenderness of the cervical facet cervical paraspinal musculature region as well as the thoracic facet thoracic paraspinal musculature region with no crepitus of the thoracic region noted. Palpation of the acromioclavicular and glenohumeral joint regions reproduces minimal discomfort. There appeared to be unremarkable Spurling's maneuver. Tinel and Phalen's maneuver associated with minimal increase of pain. Palpation over the PSIS and PII S regions reproduces minimal discomfort. There was minimal tenderness of the gluteal and piriformis musculature regions. There was mild tenderness of the greater trochanteric region and iliotibial band region. Trach leg raising was tolerated to 30 without an increase of pain with dorsiflexion noted. It appeared to be negative clonus negative Homans. EHL strength appeared to  be slightly decreased. 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  F/U PCP L Owens  for evaliation of  BP and general medical  condition  F/U surgical evaluation. Follow-up with podiatrist as discussed for lower extremity swelling and for general evaluation. Patient without plans for surgical intervention of the cervical or lumbar regions at this time  F/U neurological evaluation. May consider pending follow-up evaluations  May consider radiofrequency rhizolysis or intraspinal procedures pending response to present treatment and F/U evaluation   Patient to call Pain Management Center should patient have concerns prior to scheduled return appointment.

## 2015-06-16 NOTE — Patient Instructions (Addendum)
PLAN   Continue present medications  Neurontin hydrocodone acetaminophen and Flector patch.  Block of nerves to the sacroiliac joint to be performed at time of return appointment  F/U PCP L Barry Dieneswens  for evaliation of  BP and general medical  condition  F/U surgical evaluation. Follow-up with podiatrist as discussed for lower extremity swelling and for general evaluation  F/U neurological evaluation. May consider pending follow-up evaluations  May consider radiofrequency rhizolysis or intraspinal procedures pending response to present treatment and F/U evaluation   Patient to call Pain Management Center should patient have concerns prior to scheduled return appointment.  A prescription for HYDROCODONE was given to you today.  A prescription for GABAPENTIN was sent to your pharmacy and should be available for pickup today.

## 2015-06-17 ENCOUNTER — Encounter: Payer: 59 | Admitting: Pain Medicine

## 2015-06-29 ENCOUNTER — Other Ambulatory Visit: Payer: Self-pay | Admitting: Pain Medicine

## 2015-07-20 ENCOUNTER — Ambulatory Visit: Payer: BLUE CROSS/BLUE SHIELD | Attending: Pain Medicine | Admitting: Pain Medicine

## 2015-07-20 ENCOUNTER — Encounter: Payer: Self-pay | Admitting: Pain Medicine

## 2015-07-20 VITALS — BP 138/68 | HR 57 | Temp 97.6°F | Resp 16 | Ht 68.0 in | Wt 220.0 lb

## 2015-07-20 DIAGNOSIS — M5481 Occipital neuralgia: Secondary | ICD-10-CM | POA: Diagnosis not present

## 2015-07-20 DIAGNOSIS — Z981 Arthrodesis status: Secondary | ICD-10-CM | POA: Diagnosis not present

## 2015-07-20 DIAGNOSIS — M533 Sacrococcygeal disorders, not elsewhere classified: Secondary | ICD-10-CM | POA: Diagnosis present

## 2015-07-20 DIAGNOSIS — M5030 Other cervical disc degeneration, unspecified cervical region: Secondary | ICD-10-CM | POA: Insufficient documentation

## 2015-07-20 DIAGNOSIS — M47816 Spondylosis without myelopathy or radiculopathy, lumbar region: Secondary | ICD-10-CM

## 2015-07-20 DIAGNOSIS — M5136 Other intervertebral disc degeneration, lumbar region: Secondary | ICD-10-CM | POA: Insufficient documentation

## 2015-07-20 DIAGNOSIS — G43909 Migraine, unspecified, not intractable, without status migrainosus: Secondary | ICD-10-CM | POA: Insufficient documentation

## 2015-07-20 MED ORDER — ORPHENADRINE CITRATE ER 100 MG PO TB12: ORAL_TABLET | ORAL | Status: DC

## 2015-07-20 MED ORDER — GABAPENTIN 100 MG PO CAPS: ORAL_CAPSULE | ORAL | Status: DC

## 2015-07-20 MED ORDER — HYDROCODONE-ACETAMINOPHEN 5-325 MG PO TABS: ORAL_TABLET | ORAL | Status: AC

## 2015-07-20 NOTE — Progress Notes (Signed)
Safety precautions to be maintained throughout the outpatient stay will include: orient to surroundings, keep bed in low position, maintain call bell within reach at all times, provide assistance with transfer out of bed and ambulation.  

## 2015-07-20 NOTE — Progress Notes (Signed)
Subjective:    Patient ID: Nancy Jacobson, female    DOB: Jun 11, 1960, 55 y.o.   MRN: [id number]  HPI The patient is a 55 year old female who returns to pain management for further evaluation and treatment of pain involving the lower back and lower extremity region. The patient continues to have significant pain aggravated Buss STANDING and walking for any significant time patient appears to have significant component of pain due to muscle spasms occurring in the lumbosacral region as well. We discussed patient's condition and will begin Norflex on today's visit and will consider patient for interventional treatment consisting of lumbar facet, medial branch nerve, blocks to be performed at time of return appointment. The patient has severe increase of pain with twisting turning maneuvers especially. The patient is with prior surgical intervention of the lumbar region. There is concern regarding significant component of patient's pain began due to facet syndrome. The patient will continue hydrocodone acetaminophen as prescribed at this time as well as use of Flector patch the patient was with understanding and in agreement with suggested treatment plan. We discussed the use of Neurontin and patient will minimize the use of Neurontin to avoid any lower extremity swelling of the side effects as discussed we have also advised patient minimize acetaminophen intake as on today's visit as well . We will proceed with lumbar facet, medial branch nerve, blocks at time return appointment in attempt to decrease severity of symptoms, minimize progression of symptoms, and avoid the need for more involved treatment. The patient was in agreement with suggested treatment plan   Review of Systems     Objective:   Physical Exam  There was tenderness over the cervical facet cervical paraspinal musculature region of mild degree. There was mild tenderness of the splenius capitis and occipitalis musculature  regions. Palpation over the region of the acromioclavicular and glenohumeral joint regions reproduces minimal discomfort and patient appeared to be with bilaterally equal grip strength with unremarkable Spurling's maneuver. Tinel and Phalen's maneuver were without increased pain of significant degree. Palpation over the lumbar paraspinal musculatures and lumbar facet region was attends to palpation of moderately severe degree with lateral bending rotation extension and palpation of the lumbar facets reproducing moderately severe discomfort. Straight leg raise was tolerates approximately 20 without a definite increased pain with dorsiflexion noted. There appeared to be negative clonus negative Homans. No definite sensory deficit or dermatomal distribution of the lower extremities noted. EHL strength appeared to be decreased. Palpation of the PSIS and PII S regions reproduces moderate to moderately severe discomfort as well. There was negative clonus and negative Homans. Abdomen was nontender with no costovertebral angle tenderness noted.      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 Begin muscle relaxant Norflex as discussed. Caution Norflex may cause respiratory depression, excessive sedation, confusion and other side effects. Stay in presence of an adult driver when beginning Norflex and go to emergency room immediately if you develop any undesirable side effects  Lumbar facet, medial branch nerve, blocks to be performed at time of return appointment  F/U PCP [name]  for evaliation of  BP and general medical  condition  F/U surgical evaluation. Follow-up with  podiatrist as  discussed for lower extremity swelling and for general evaluation  F/U neurological evaluation. May consider pending follow-up evaluations  May consider radiofrequency rhizolysis or intraspinal procedures pending response to present treatment and F/U evaluation   Patient to call [facility] should patient have concerns prior to scheduled return appointment

## 2015-07-20 NOTE — Patient Instructions (Addendum)
PLAN   Continue present medications  Neurontin hydrocodone acetaminophen and Flector patch.. Begin muscle relaxant Norflex as discussed. Caution Norflex may cause respiratory depression, excessive sedation, confusion and other side effects. Stay in presence of an adult driver when beginning Norflex and go to emergency room immediately if you develop any undesirable side effects  Lumbar facet, medial branch nerve, blocks to be performed at time of return appointment  F/U PCP [name]  for evaliation of  BP and general medical  condition  F/U surgical evaluation. Follow-up with podiatrist as discussed for lower extremity swelling and for general evaluation  F/U neurological evaluation. May consider pending follow-up evaluations  May consider radiofrequency rhizolysis or intraspinal procedures pending response to present treatment and F/U evaluation   Patient to call [facility] Management Center should patient have concerns prior to scheduled return appointment.Facet Blocks Patient Information  Description: The facets are joints in the spine between the vertebrae.  Like any joints in the body, facets can become irritated and painful.  Arthritis can also effect the facets.  By injecting steroids and local anesthetic in and around these joints, we can temporarily block the nerve supply to them.  Steroids act directly on irritated nerves and tissues to reduce selling and inflammation which often leads to decreased pain.  Facet blocks may be done anywhere along the spine from the neck to the low back depending upon the location of your pain.   After numbing the skin with local anesthetic (like Novocaine), a small needle is passed onto the facet joints under x-ray guidance.  You may experience a sensation of pressure while this is being done.  The entire block usually lasts about 15-25 minutes.   Conditions which may be treated by facet blocks:   Low back/buttock pain  Neck/shoulder pain  Certain types  of headaches  Preparation for the injection:  1. Do not eat any solid food or dairy products within 6 hours of your appointment. 2. You may drink clear liquid up to 2 hours before appointment.  Clear liquids include water, black coffee, juice or soda.  No milk or cream please. 3. You may take your regular medication, including pain medications, with a sip of water before your appointment.  Diabetics should hold regular insulin (if taken separately) and take 1/2 normal NPH dose the morning of the procedure.  Carry some sugar containing items with you to your appointment. 4. A driver must accompany you and be prepared to drive you home after your procedure. 5. Bring all your current medications with you. 6. An IV may be inserted and sedation may be given at the discretion of the physician. 7. A blood pressure cuff, EKG and other monitors will often be applied during the procedure.  Some patients may need to have extra oxygen administered for a short period. 8. You will be asked to provide medical information, including your allergies and medications, prior to the procedure.  We must know immediately if you are taking blood thinners (like Coumadin/Warfarin) or if you are allergic to IV iodine contrast (dye).  We must know if you could possible be pregnant.  Possible side-effects:   Bleeding from needle site  Infection (rare, may require surgery)  Nerve injury (rare)  Numbness & tingling (temporary)  Difficulty urinating (rare, temporary)  Spinal headache (a headache worse with upright posture)  Light-headedness (temporary)  Pain at injection site (serveral days)  Decreased blood pressure (rare, temporary)  Weakness in arm/leg (temporary)  Pressure sensation in back/neck (temporary)  Call if you experience:   Fever/chills associated with headache or increased back/neck pain  Headache worsened by an upright position  New onset, weakness or numbness of an extremity below the  injection site  Hives or difficulty breathing (go to the emergency room)  Inflammation or drainage at the injection site(s)  Severe back/neck pain greater than usual  New symptoms which are concerning to you  Please note:  Although the local anesthetic injected can often make your back or neck feel good for several hours after the injection, the pain will likely return. It takes 3-7 days for steroids to work.  You may not notice any pain relief for at least one week.  If effective, we will often do a series of 2-3 injections spaced 3-6 weeks apart to maximally decrease your pain.  After the initial series, you may be a candidate for a more permanent nerve block of the facets.  If you have any questions, please call #[phone] [facility] Clinic[facility] R[facility] AND COMPLICATIONS  What are the risk, side effects and possible complications? Generally speaking, most procedures are safe.  However, with any procedure there are risks, side effects, and the possibility of complications.  The risks and complications are dependent upon the sites that are lesioned, or the type of nerve block to be performed.  The closer the procedure is to the spine, the more serious the risks are.  Great care is taken when placing the radio frequency needles, block needles or lesioning probes, but sometimes complications can occur. 1. Infection: Any time there is an injection through the skin, there is a risk of infection.  This is why sterile conditions are used for these blocks.  There are four possible types of infection. 1. Localized skin infection. 2. Central Nervous System Infection-This can be in the form of Meningitis, which can be deadly. 3. Epidural Infections-This can be in the form of an epidural abscess, which can cause pressure inside of the spine, causing compression of the spinal cord with subsequent paralysis. This would require an emergency surgery to decompress, and there  are no guarantees that the patient would recover from the paralysis. 4. Discitis-This is an infection of the intervertebral discs.  It occurs in about 1% of discography procedures.  It is difficult to treat and it may lead to surgery.        2. Pain: the needles have to go through skin and soft tissues, will cause soreness.       3. Damage to internal structures:  The nerves to be lesioned may be near blood vessels or    other nerves which can be potentially damaged.       4. Bleeding: Bleeding is more common if the patient is taking blood thinners such as  aspirin, Coumadin, Ticiid, Plavix, etc., or if he/she have some genetic predisposition  such as hemophilia. Bleeding into the spinal canal can cause compression of the spinal  cord with subsequent paralysis.  This would require an emergency surgery to  decompress and there are no guarantees that the patient would recover from the  paralysis.       5. Pneumothorax:  Puncturing of a lung is a possibility, every time a needle is introduced in  the area of the chest or upper back.  Pneumothorax refers to free air around the  collapsed lung(s), inside of the thoracic cavity (chest cavity).  Another two possible  complications related to a similar event would include: Hemothorax  and Chylothorax.   These are variations of the Pneumothorax, where instead of air around the collapsed  lung(s), you may have blood or chyle, respectively.       6. Spinal headaches: They may occur with any procedures in the area of the spine.       7. Persistent CSF (Cerebro-Spinal Fluid) leakage: This is a rare problem, but may occur  with prolonged intrathecal or epidural catheters either due to the formation of a fistulous  track or a dural tear.       8. Nerve damage: By working so close to the spinal cord, there is always a possibility of  nerve damage, which could be as serious as a permanent spinal cord injury with  paralysis.       9. Death:  Although rare, severe deadly  allergic reactions known as "Anaphylactic  reaction" can occur to any of the medications used.      10. Worsening of the symptoms:  We can always make thing worse.  What are the chances of something like this happening? Chances of any of this occuring are extremely low.  By statistics, you have more of a chance of getting killed in a motor vehicle accident: while driving to the hospital than any of the above occurring .  Nevertheless, you should be aware that they are possibilities.  In general, it is similar to taking a shower.  Everybody knows that you can slip, hit your head and get killed.  Does that mean that you should not shower again?  Nevertheless always keep in mind that statistics do not mean anything if you happen to be on the wrong side of them.  Even if a procedure has a 1 (one) in a 1,000,000 (million) chance of going wrong, it you happen to be that one..Also, keep in mind that by statistics, you have more of a chance of having something go wrong when taking medications.  Who should not have this procedure? If you are on a blood thinning medication (e.g. Coumadin, Plavix, see list of "Blood Thinners"), or if you have an active infection going on, you should not have the procedure.  If you are taking any blood thinners, please inform your physician.  How should I prepare for this procedure?  Do not eat or drink anything at least six hours prior to the procedure.  Bring a driver with you .  It cannot be a taxi.  Come accompanied by an adult that can drive you back, and that is strong enough to help you if your legs get weak or numb from the local anesthetic.  Take all of your medicines the morning of the procedure with just enough water to swallow them.  If you have diabetes, make sure that you are scheduled to have your procedure done first thing in the morning, whenever possible.  If you have diabetes, take only half of your insulin dose and notify our nurse that you have done so  as soon as you arrive at the clinic.  If you are diabetic, but only take blood sugar pills (oral hypoglycemic), then do not take them on the morning of your procedure.  You may take them after you have had the procedure.  Do not take aspirin or any aspirin-containing medications, at least eleven (11) days prior to the procedure.  They may prolong bleeding.  Wear loose fitting clothing that may be easy to take off and that you would not mind if it got stained with  Betadine or blood.  Do not wear any jewelry or perfume  Remove any nail coloring.  It will interfere with some of our monitoring equipment.  NOTE: Remember that this is not meant to be interpreted as a complete list of all possible complications.  Unforeseen problems may occur.  BLOOD THINNERS The following drugs contain aspirin or other products, which can cause increased bleeding during surgery and should not be taken for 2 weeks prior to and 1 week after surgery.  If you should need take something for relief of minor pain, you may take acetaminophen which is found in Tylenol,m Datril, Anacin-3 and Panadol. It is not blood thinner. The products listed below are.  Do not take any of the products listed below in addition to any listed on your instruction sheet.  A.P.C or A.P.C with Codeine Codeine Phosphate Capsules #3 Ibuprofen Ridaura  ABC compound Congesprin Imuran rimadil  Advil Cope Indocin Robaxisal  Alka-Seltzer Effervescent Pain Reliever and Antacid Coricidin or Coricidin-D  Indomethacin Rufen  Alka-Seltzer plus Cold Medicine Cosprin Ketoprofen S-A-C Tablets  Anacin Analgesic Tablets or Capsules Coumadin Korlgesic Salflex  Anacin Extra Strength Analgesic tablets or capsules CP-2 Tablets Lanoril Salicylate  Anaprox Cuprimine Capsules Levenox Salocol  Anexsia-D Dalteparin Magan Salsalate  Anodynos Darvon compound Magnesium Salicylate Sine-off  Ansaid Dasin Capsules Magsal Sodium Salicylate  Anturane Depen Capsules Marnal  Soma  APF Arthritis pain formula Dewitt's Pills Measurin Stanback  Argesic Dia-Gesic Meclofenamic Sulfinpyrazone  Arthritis Bayer Timed Release Aspirin Diclofenac Meclomen Sulindac  Arthritis pain formula Anacin Dicumarol Medipren Supac  Analgesic (Safety coated) Arthralgen Diffunasal Mefanamic Suprofen  Arthritis Strength Bufferin Dihydrocodeine Mepro Compound Suprol  Arthropan liquid Dopirydamole Methcarbomol with Aspirin Synalgos  ASA tablets/Enseals Disalcid Micrainin Tagament  Ascriptin Doan's Midol Talwin  Ascriptin A/D Dolene Mobidin Tanderil  Ascriptin Extra Strength Dolobid Moblgesic Ticlid  Ascriptin with Codeine Doloprin or Doloprin with Codeine Momentum Tolectin  Asperbuf Duoprin Mono-gesic Trendar  Aspergum Duradyne Motrin or Motrin IB Triminicin  Aspirin plain, buffered or enteric coated Durasal Myochrisine Trigesic  Aspirin Suppositories Easprin Nalfon Trillsate  Aspirin with Codeine Ecotrin Regular or Extra Strength Naprosyn Uracel  Atromid-S Efficin Naproxen Ursinus  Auranofin Capsules Elmiron Neocylate Vanquish  Axotal Emagrin Norgesic Verin  Azathioprine Empirin or Empirin with Codeine Normiflo Vitamin E  Azolid Emprazil Nuprin Voltaren  Bayer Aspirin plain, buffered or children's or timed BC Tablets or powders Encaprin Orgaran Warfarin Sodium  Buff-a-Comp Enoxaparin Orudis Zorpin  Buff-a-Comp with Codeine Equegesic Os-Cal-Gesic   Buffaprin Excedrin plain, buffered or Extra Strength Oxalid   Bufferin Arthritis Strength Feldene Oxphenbutazone   Bufferin plain or Extra Strength Feldene Capsules Oxycodone with Aspirin   Bufferin with Codeine Fenoprofen Fenoprofen Pabalate or Pabalate-SF   Buffets II Flogesic Panagesic   Buffinol plain or Extra Strength Florinal or Florinal with Codeine Panwarfarin   Buf-Tabs Flurbiprofen Penicillamine   Butalbital Compound Four-way cold tablets Penicillin   Butazolidin Fragmin Pepto-Bismol   Carbenicillin Geminisyn Percodan   Carna  Arthritis Reliever Geopen Persantine   Carprofen Gold's salt Persistin   Chloramphenicol Goody's Phenylbutazone   Chloromycetin Haltrain Piroxlcam   Clmetidine heparin Plaquenil   Cllnoril Hyco-pap Ponstel   Clofibrate Hydroxy chloroquine Propoxyphen         Before stopping any of these medications, be sure to consult the physician who ordered them.  Some, such as Coumadin (Warfarin) are ordered to prevent or treat serious conditions such as "deep thrombosis", "pumonary embolisms", and other heart problems.  The amount of time that you may  need off of the medication may also vary with the medication and the reason for which you were taking it.  If you are taking any of these medications, please make sure you notify your pain physician before you undergo any procedures.

## 2015-08-04 ENCOUNTER — Ambulatory Visit: Payer: BLUE CROSS/BLUE SHIELD | Admitting: Pain Medicine

## 2015-08-17 ENCOUNTER — Encounter: Payer: BLUE CROSS/BLUE SHIELD | Admitting: Pain Medicine

## 2015-11-30 ENCOUNTER — Other Ambulatory Visit: Payer: Self-pay | Admitting: Specialist

## 2015-11-30 DIAGNOSIS — M25561 Pain in right knee: Secondary | ICD-10-CM

## 2015-12-17 ENCOUNTER — Ambulatory Visit
Admission: RE | Admit: 2015-12-17 | Discharge: 2015-12-17 | Disposition: A | Discharge status: Home / Self Care | Payer: BLUE CROSS/BLUE SHIELD | Source: Ambulatory Visit | Attending: Specialist | Admitting: Specialist

## 2015-12-17 DIAGNOSIS — S83281A Other tear of lateral meniscus, current injury, right knee, initial encounter: Secondary | ICD-10-CM | POA: Diagnosis not present

## 2015-12-17 DIAGNOSIS — X58XXXA Exposure to other specified factors, initial encounter: Secondary | ICD-10-CM | POA: Insufficient documentation

## 2015-12-17 DIAGNOSIS — M179 Osteoarthritis of knee, unspecified: Secondary | ICD-10-CM | POA: Diagnosis not present

## 2015-12-17 DIAGNOSIS — M25561 Pain in right knee: Secondary | ICD-10-CM | POA: Diagnosis present

## 2016-01-12 ENCOUNTER — Other Ambulatory Visit: Payer: Self-pay | Admitting: Specialist

## 2016-01-17 ENCOUNTER — Encounter
Admission: RE | Admit: 2016-01-17 | Discharge: 2016-01-17 | Disposition: A | Discharge status: Home / Self Care | Payer: BLUE CROSS/BLUE SHIELD | Source: Ambulatory Visit | Attending: Specialist | Admitting: Specialist

## 2016-01-17 HISTORY — DX: Chronic kidney disease, unspecified: N18.9

## 2016-01-17 HISTORY — DX: Sleep apnea, unspecified: G47.30

## 2016-01-17 NOTE — Pre-Procedure Instructions (Signed)
ECG 12 lead (12/08/2015 8:09 AM) ECG 12 lead (12/08/2015 8:09 AM)  Component Value Ref Range  EKG Systolic BP  mmHg  EKG Diastolic BP  mmHg  EKG Ventricular Rate 73 BPM  EKG Atrial Rate 73 BPM  EKG P-R Interval 172 ms  EKG QRS Duration 86 ms  EKG Q-T Interval 394 ms  EKG QTC Calculation 434 ms  EKG Calculated P Axis 53 degrees  EKG Calculated R Axis 13 degrees  EKG Calculated T Axis 28 degrees   ECG 12 lead (12/08/2015 8:09 AM)  Specimen Performing Laboratory   [facility]  [address]  [place], [place] [id number]    ECG 12 lead (12/08/2015 8:09 AM)  Narrative  NORMAL SINUS RHYTHM  NORMAL ECG  NO PREVIOUS ECGS AVAILABLE  Confirmed by Julio AlmEEN, CODY ([id number]) on 12/14/2015 12:38:13 PM    ECG 12 lead (12/08/2015 8:09 AM)  Procedure Note  Interface, Rad Results In - 12/14/2015 12:38 PM EDT  NORMAL SINUS RHYTHM NORMAL ECG NO PREVIOUS ECGS AVAILABLE Confirmed by Julio AlmEEN, CODY ([id number]) on 12/14/2015 12:38:13 PM

## 2016-01-17 NOTE — Pre-Procedure Instructions (Signed)
Comprehensive Metabolic Panel (11/24/2015 8:41 AM) Comprehensive Metabolic Panel (11/24/2015 8:41 AM)  Component Value Ref Range  Sodium 140 135 - 145 mmol/L  Potassium 4.4 3.5 - 5.0 mmol/L  Chloride 102 98 - 107 mmol/L  CO2 29.0 22.0 - 30.0 mmol/L  BUN 12 7 - 21 mg/dL  Creatinine 6.040.85 5.400.60 - 1.00 mg/dL  BUN/Creatinine Ratio 14   GFR MDRD Non Af Amer >=60 >=60 mL/min/1.1573m2  GFR MDRD Af Amer >=60 >=60 mL/min/1.8473m2  Anion Gap 9 9 - 15 mmol/L  Glucose 90 65 - 99 mg/dL  Calcium 9.1 8.5 - 98.110.2 mg/dL  Albumin 4.0 3.5 - 5.0 g/dL  Total Protein 8.0 6.6 - 8.0 g/dL  Total Bilirubin 0.7 0.0 - 1.2 mg/dL  AST 28 14 - 38 U/L  ALT 19 15 - 48 U/L  Alkaline Phosphatase 133 (H) 38 - 126 U/L   Comprehensive Metabolic Panel (11/24/2015 8:41 AM)  Specimen Performing Laboratory  [facility]  [address]  [place], [place] [id number]

## 2016-01-17 NOTE — Patient Instructions (Signed)
  Your procedure is scheduled on: 01-24-16 Report to Same Day Surgery 2nd floor medical mall To find out your arrival time please call [phone] between 1PM - 3PM on 01-21-16  Remember: Instructions that are not followed completely may result in serious medical risk, up to and including death, or upon the discretion of your surgeon and anesthesiologist your surgery may need to be rescheduled.    _x___ 1. Do not eat food or drink liquids after midnight. No gum chewing or hard candies.     __x__ 2. No Alcohol for 24 hours before or after surgery.   __x__3. No Smoking for 24 prior to surgery.   ____  4. Bring all medications with you on the day of surgery if instructed.    __x__ 5. Notify your doctor if there is any change in your medical condition     (cold, fever, infections).     Do not wear jewelry, make-up, hairpins, clips or nail polish.  Do not wear lotions, powders, or perfumes. You may wear deodorant.  Do not shave 48 hours prior to surgery. Men may shave face and neck.  Do not bring valuables to the hospital.    [facility] is not responsible for any belongings or valuables.               Contacts, dentures or bridgework may not be worn into surgery.  Leave your suitcase in the car. After surgery it may be brought to your room.  For patients admitted to the hospital, discharge time is determined by your treatment team.   Patients discharged the day of surgery will not be allowed to drive home.    Please read over the following fact sheets that you were given:   [facility] Preparing for Surgery and or MRSA Information   ____ Take these medicines the morning of surgery with A SIP OF WATER:    1. none  2.  3.  4.  5.  6.  ____ Fleet Enema (as directed)   ____ Use CHG Soap or sage wipes as directed on instruction sheet   ____ Use inhalers on the day of surgery and bring to hospital day of surgery  ____ Stop metformin 2 days prior to surgery    ____ Take 1/2 of  usual insulin dose the night before surgery and none on the morning of surgery.   ____ Stop aspirin or coumadin, or plavix  _x__ Stop Anti-inflammatories such as Advil, Aleve, Ibuprofen, Motrin, Naproxen,          Naprosyn, Goodies powders or aspirin products. Ok to take Tylenol.   ____ Stop supplements until after surgery.    ____ Bring C-Pap to the hospital.

## 2016-01-24 ENCOUNTER — Ambulatory Visit: Payer: BLUE CROSS/BLUE SHIELD | Admitting: Anesthesiology

## 2016-01-24 ENCOUNTER — Ambulatory Visit
Admission: RE | Admit: 2016-01-24 | Discharge: 2016-01-24 | Disposition: A | Discharge status: Home / Self Care | Payer: BLUE CROSS/BLUE SHIELD | Source: Ambulatory Visit | Attending: Specialist | Admitting: Specialist

## 2016-01-24 ENCOUNTER — Encounter
Admission: RE | Disposition: A | Discharge status: Home / Self Care | Payer: Self-pay | Source: Ambulatory Visit | Attending: Specialist

## 2016-01-24 ENCOUNTER — Encounter: Payer: Self-pay | Admitting: *Deleted

## 2016-01-24 DIAGNOSIS — M2341 Loose body in knee, right knee: Secondary | ICD-10-CM | POA: Insufficient documentation

## 2016-01-24 DIAGNOSIS — M1990 Unspecified osteoarthritis, unspecified site: Secondary | ICD-10-CM | POA: Diagnosis not present

## 2016-01-24 DIAGNOSIS — S83271A Complex tear of lateral meniscus, current injury, right knee, initial encounter: Secondary | ICD-10-CM | POA: Insufficient documentation

## 2016-01-24 DIAGNOSIS — X58XXXA Exposure to other specified factors, initial encounter: Secondary | ICD-10-CM | POA: Insufficient documentation

## 2016-01-24 DIAGNOSIS — M659 Synovitis and tenosynovitis, unspecified: Secondary | ICD-10-CM | POA: Insufficient documentation

## 2016-01-24 DIAGNOSIS — Z87442 Personal history of urinary calculi: Secondary | ICD-10-CM | POA: Diagnosis not present

## 2016-01-24 DIAGNOSIS — M9420 Chondromalacia, unspecified site: Secondary | ICD-10-CM | POA: Insufficient documentation

## 2016-01-24 HISTORY — PX: KNEE ARTHROSCOPY: SHX127

## 2016-01-24 SURGERY — ARTHROSCOPY, KNEE
Anesthesia: General | Laterality: Right

## 2016-01-24 MED ORDER — GABAPENTIN 400 MG PO CAPS
400.0000 mg | ORAL_CAPSULE | Freq: Once | ORAL | Status: AC
  Administered 2016-01-24: 400 mg via ORAL

## 2016-01-24 MED ORDER — ROCURONIUM BROMIDE 100 MG/10ML IV SOLN
INTRAVENOUS | Status: DC | PRN
  Administered 2016-01-24: 30 mg via INTRAVENOUS

## 2016-01-24 MED ORDER — FENTANYL CITRATE (PF) 100 MCG/2ML IJ SOLN
25.0000 ug | INTRAMUSCULAR | Status: DC | PRN
  Administered 2016-01-24 (×2): 25 ug via INTRAVENOUS

## 2016-01-24 MED ORDER — LIDOCAINE HCL (CARDIAC) 20 MG/ML IV SOLN
INTRAVENOUS | Status: DC | PRN
  Administered 2016-01-24: 100 mg via INTRAVENOUS

## 2016-01-24 MED ORDER — FENTANYL CITRATE (PF) 100 MCG/2ML IJ SOLN
INTRAMUSCULAR | Status: AC
  Administered 2016-01-24: 25 ug via INTRAVENOUS
  Filled 2016-01-24: qty 2

## 2016-01-24 MED ORDER — CLINDAMYCIN PHOSPHATE 600 MG/50ML IV SOLN
600.0000 mg | Freq: Once | INTRAVENOUS | Status: AC
  Administered 2016-01-24: 600 mg via INTRAVENOUS

## 2016-01-24 MED ORDER — OXYCODONE HCL 5 MG PO TABS: 5.0000 mg | ORAL_TABLET | Freq: Once | ORAL | Status: DC | PRN

## 2016-01-24 MED ORDER — MIDAZOLAM HCL 2 MG/2ML IJ SOLN
INTRAMUSCULAR | Status: DC | PRN
  Administered 2016-01-24: 2 mg via INTRAVENOUS

## 2016-01-24 MED ORDER — MEPERIDINE HCL 25 MG/ML IJ SOLN: 6.2500 mg | INTRAMUSCULAR | Status: DC | PRN

## 2016-01-24 MED ORDER — BUPIVACAINE-EPINEPHRINE (PF) 0.5% -1:200000 IJ SOLN
INTRAMUSCULAR | Status: AC
  Filled 2016-01-24: qty 60

## 2016-01-24 MED ORDER — CHLORHEXIDINE GLUCONATE CLOTH 2 % EX PADS: 6.0000 | MEDICATED_PAD | Freq: Once | CUTANEOUS | Status: DC

## 2016-01-24 MED ORDER — MORPHINE SULFATE (PF) 4 MG/ML IV SOLN
INTRAVENOUS | Status: AC
  Filled 2016-01-24: qty 1

## 2016-01-24 MED ORDER — HYDROCODONE-ACETAMINOPHEN 7.5-325 MG PO TABS: 1.0000 | ORAL_TABLET | Freq: Four times a day (QID) | ORAL | 0 refills | Status: AC | PRN

## 2016-01-24 MED ORDER — DEXAMETHASONE SODIUM PHOSPHATE 10 MG/ML IJ SOLN
INTRAMUSCULAR | Status: DC | PRN
  Administered 2016-01-24: 10 mg via INTRAVENOUS

## 2016-01-24 MED ORDER — MELOXICAM 7.5 MG PO TABS
15.0000 mg | ORAL_TABLET | Freq: Once | ORAL | Status: AC
  Administered 2016-01-24: 15 mg via ORAL

## 2016-01-24 MED ORDER — GABAPENTIN 400 MG PO CAPS: 400.0000 mg | ORAL_CAPSULE | Freq: Three times a day (TID) | ORAL | 3 refills | Status: AC

## 2016-01-24 MED ORDER — SUCCINYLCHOLINE CHLORIDE 20 MG/ML IJ SOLN
INTRAMUSCULAR | Status: DC | PRN
  Administered 2016-01-24: 100 mg via INTRAVENOUS

## 2016-01-24 MED ORDER — MORPHINE SULFATE (PF) 4 MG/ML IV SOLN
INTRAVENOUS | Status: DC | PRN
  Administered 2016-01-24: 4 mg via INTRAMUSCULAR

## 2016-01-24 MED ORDER — MELOXICAM 15 MG PO TABS: 15.0000 mg | ORAL_TABLET | Freq: Every day | ORAL | 3 refills | Status: AC

## 2016-01-24 MED ORDER — CEFAZOLIN SODIUM-DEXTROSE 2-4 GM/100ML-% IV SOLN
2.0000 g | INTRAVENOUS | Status: AC
  Administered 2016-01-24: 2 g via INTRAVENOUS

## 2016-01-24 MED ORDER — PHENYLEPHRINE HCL 10 MG/ML IJ SOLN
INTRAMUSCULAR | Status: DC | PRN
  Administered 2016-01-24: 100 ug via INTRAVENOUS
  Administered 2016-01-24: 200 ug via INTRAVENOUS
  Administered 2016-01-24 (×3): 100 ug via INTRAVENOUS

## 2016-01-24 MED ORDER — LACTATED RINGERS IV SOLN
INTRAVENOUS | Status: DC
  Administered 2016-01-24: 13:00:00 via INTRAVENOUS

## 2016-01-24 MED ORDER — BUPIVACAINE-EPINEPHRINE (PF) 0.5% -1:200000 IJ SOLN
INTRAMUSCULAR | Status: DC | PRN
  Administered 2016-01-24: 34 mL
  Administered 2016-01-24: 20 mL

## 2016-01-24 MED ORDER — FENTANYL CITRATE (PF) 100 MCG/2ML IJ SOLN
INTRAMUSCULAR | Status: DC | PRN
  Administered 2016-01-24 (×2): 50 ug via INTRAVENOUS

## 2016-01-24 MED ORDER — PROMETHAZINE HCL 25 MG/ML IJ SOLN: 6.2500 mg | INTRAMUSCULAR | Status: DC | PRN

## 2016-01-24 MED ORDER — ONDANSETRON HCL 4 MG/2ML IJ SOLN
INTRAMUSCULAR | Status: DC | PRN
  Administered 2016-01-24 (×2): 4 mg via INTRAVENOUS

## 2016-01-24 MED ORDER — PROPOFOL 10 MG/ML IV BOLUS
INTRAVENOUS | Status: DC | PRN
  Administered 2016-01-24: 100 mg via INTRAVENOUS
  Administered 2016-01-24: 200 mg via INTRAVENOUS

## 2016-01-24 MED ORDER — OXYCODONE HCL 5 MG/5ML PO SOLN: 5.0000 mg | Freq: Once | ORAL | Status: DC | PRN

## 2016-01-24 SURGICAL SUPPLY — 25 items
BAG COUNTER SPONGE EZ (MISCELLANEOUS) IMPLANT
BLADE AGGRESSIVE PLUS 4.0 (BLADE) IMPLANT
BUR RADIUS 4.0X18.5 (BURR) IMPLANT
CHLORAPREP W/TINT 26ML (MISCELLANEOUS) ×3 IMPLANT
COUNTER SPONGE BAG EZ (MISCELLANEOUS)
CUFF TOURN 24 STER (MISCELLANEOUS) IMPLANT
CUFF TOURN 30 STER DUAL PORT (MISCELLANEOUS) ×3 IMPLANT
CUTTER SLOTTED WHISKER 4.0 (BURR) IMPLANT
GAUZE SPONGE 4X4 12PLY STRL (GAUZE/BANDAGES/DRESSINGS) ×3 IMPLANT
GLOVE BIO SURGEON STRL SZ8 (GLOVE) ×3 IMPLANT
GOWN STRL REUS W/ TWL LRG LVL3 (GOWN DISPOSABLE) ×2 IMPLANT
GOWN STRL REUS W/TWL LRG LVL3 (GOWN DISPOSABLE) ×4
IV LACTATED RINGER IRRG 3000ML (IV SOLUTION) ×8
IV LR IRRIG 3000ML ARTHROMATIC (IV SOLUTION) ×4 IMPLANT
KIT RM TURNOVER STRD PROC AR (KITS) ×3 IMPLANT
MANIFOLD NEPTUNE II (INSTRUMENTS) ×3 IMPLANT
NDL SAFETY 18GX1.5 (NEEDLE) ×6 IMPLANT
PACK ARTHROSCOPY KNEE (MISCELLANEOUS) ×3 IMPLANT
SET TUBE SUCT SHAVER OUTFL 24K (TUBING) ×3 IMPLANT
SOL PREP PVP 2OZ (MISCELLANEOUS) ×3
SOLUTION PREP PVP 2OZ (MISCELLANEOUS) ×1 IMPLANT
SUT ETHILON 3 0 FSLX (SUTURE) ×3 IMPLANT
SYR 30ML LL (SYRINGE) ×6 IMPLANT
TUBING ARTHRO INFLOW-ONLY STRL (TUBING) ×3 IMPLANT
WAND HAND CNTRL MULTIVAC 50 (MISCELLANEOUS) ×3 IMPLANT

## 2016-01-24 NOTE — Anesthesia Procedure Notes (Signed)
Procedure Name: Intubation Date/Time: 01/24/2016 1:39 PM Performed by: Michaele OfferSAVAGE, Paulo Keimig Pre-anesthesia Checklist: Patient identified, Emergency Drugs available, Suction available, Patient being monitored and Timeout performed Patient Re-evaluated:Patient Re-evaluated prior to inductionOxygen Delivery Method: Circle system utilized Preoxygenation: Pre-oxygenation with 100% oxygen Intubation Type: IV induction, Rapid sequence and Cricoid Pressure applied Ventilation: Mask ventilation without difficulty Laryngoscope Size: Mac and 3 Grade View: Grade I Tube type: Oral Tube size: 7.0 mm Number of attempts: 1 Airway Equipment and Method: Rigid stylet Placement Confirmation: ETT inserted through vocal cords under direct vision,  positive ETCO2 and breath sounds checked- equal and bilateral Secured at: 20 cm Tube secured with: Tape Dental Injury: Teeth and Oropharynx as per pre-operative assessment

## 2016-01-24 NOTE — Discharge Instructions (Signed)

## 2016-01-24 NOTE — Anesthesia Preprocedure Evaluation (Signed)
Anesthesia Evaluation  Patient identified by MRN, date of birth, ID band Patient awake    Reviewed: Allergy & Precautions, NPO status , Patient's Chart, lab work & pertinent test results  History of Anesthesia Complications Negative for: history of anesthetic complications  Airway Mallampati: II  TM Distance: >3 FB Neck ROM: Full    Dental no notable dental hx.    Pulmonary Sleep apnea: had OSA prior to gastric bypass, no longer needs CPAP. , neg COPD,    breath sounds clear to auscultation- rhonchi (-) wheezing      Cardiovascular Exercise Tolerance: Good hypertension, (-) CAD and (-) Past MI  Rhythm:Regular Rate:Normal - Systolic murmurs and - Diastolic murmurs    Neuro/Psych negative neurological ROS  negative psych ROS   GI/Hepatic negative GI ROS, Neg liver ROS,   Endo/Other  negative endocrine ROS  Renal/GU Renal disease: hx of nephrolithasis.  Female genitourinary complaint: .pmh.     Musculoskeletal  (+) Arthritis , Osteoarthritis,    Abdominal (+) + obese,   Peds  Hematology negative hematology ROS (+)   Anesthesia Other Findings Past Medical History: No date: Chronic kidney disease     Comment: h/o stones No date: Hypertension No date: Sleep apnea     Comment: h/o but had gastric bypass in 2012 and does               not have to use CPAP anymore   Reproductive/Obstetrics                            Anesthesia Physical Anesthesia Plan  ASA: II  Anesthesia Plan: General   Post-op Pain Management:    Induction: Intravenous  Airway Management Planned: LMA  Additional Equipment:   Intra-op Plan:   Post-operative Plan:   Informed Consent: I have reviewed the patients History and Physical, chart, labs and discussed the procedure including the risks, benefits and alternatives for the proposed anesthesia with the patient or authorized representative who has indicated  his/her understanding and acceptance.   Dental advisory given  Plan Discussed with: CRNA and Anesthesiologist  Anesthesia Plan Comments:         Anesthesia Quick Evaluation

## 2016-01-24 NOTE — H&P (Signed)
THE PATIENT WAS SEEN PRIOR TO SURGERY TODAY.  HISTORY, ALLERGIES, HOME MEDICATIONS AND OPERATIVE PROCEDURE WERE REVIEWED. RISKS AND BENEFITS OF SURGERY DISCUSSED WITH PATIENT AGAIN.  NO CHANGES FROM INITIAL HISTORY AND PHYSICAL NOTED.    

## 2016-01-24 NOTE — Transfer of Care (Signed)
Immediate Anesthesia Transfer of Care Note  Patient: Nancy Jacobson  Procedure(s) Performed: Procedure(s): ARTHROSCOPY KNEE (Right)  Patient Location: PACU  Anesthesia Type:General  Level of Consciousness: awake, alert , oriented and patient cooperative  Airway & Oxygen Therapy: Patient Spontanous Breathing and Patient connected to face mask oxygen  Post-op Assessment: Report given to RN, Post -op Vital signs reviewed and stable and Patient moving all extremities X 4  Post vital signs: Reviewed and stable  Last Vitals:  Vitals:   01/24/16 1256 01/24/16 1529  BP: (!) 132/58 (!) 148/77  Pulse: 73 94  Resp: 18 16  Temp: 36.7 C 36.3 C    Last Pain:  Vitals:   01/24/16 1256  TempSrc: Oral  PainSc: 6          Complications: No apparent anesthesia complications

## 2016-01-24 NOTE — Op Note (Signed)
01/24/2016  3:15 PM  PATIENT:  Nancy Jacobson    PRE-OPERATIVE DIAGNOSIS:  Complex tear of lateral meniscus, current injury, right knee  POST-OPERATIVE DIAGNOSIS:  Same  PROCEDURE:  ARTHROSCOPY KNEE  SURGEON:  Thedford Bunton E, MD  COMPLICATIONS:   None  EBL:  Minimal  TOURNIQUET TIME:   None     .  ANESTHESIA:  General LMA  PREOPERATIVE INDICATIONS:  Nancy Jacobson is a  55 y.o. female with a diagnosis of Complex tear of lateral meniscus, current injury, right knee who failed conservative measures and elected for surgical management.    The risks benefits and alternatives were discussed with the patient preoperatively including but not limited to the risks of infection, bleeding, nerve injury, cardiopulmonary complications, the need for revision surgery, among others, and the patient was willing to proceed.  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 Patellofemoral wear.  Loose bodies.  Medial joint fairly normal with small meniscus.  Synovitis  OPERATIVE PROCEDURE: The patient was brought to the operating room and underwent satisfactory general LMA anesthesia in the supine position.  The leg was prepped and draped in a sterile fashion.  Arthroscopy was carried out through standard portals.  The above findings were encountered upon arthroscopy.  The complex tear of the lateral meniscus was debrided with basket forceps, motorized resector, and ArthroCare wand.  The lateral femoral condyle was debrided as well.  Minimal synovial resection was carried out for visualization.  Medial compartment was essentially normal.  Small loose bodies were suctioned out.  Once this was completed and stabilized the joint was thoroughly irrigated.  Instruments were removed and knee wounds closed with 3-0 nylon.  Sponge and needle counts were correct. A dry sterile dressing was applied.  Patient was awakened and taken to recovery in  good condition.   Valinda HoarHoward E Reiley Keisler, MD

## 2016-01-25 NOTE — Anesthesia Postprocedure Evaluation (Signed)
Anesthesia Post Note  Patient: Nancy Jacobson  Procedure(s) Performed: Procedure(s) (LRB): ARTHROSCOPY KNEE (Right)  Patient location during evaluation: PACU Anesthesia Type: General Level of consciousness: awake and alert Pain management: pain level controlled Vital Signs Assessment: post-procedure vital signs reviewed and stable Respiratory status: spontaneous breathing, nonlabored ventilation, respiratory function stable and patient connected to nasal cannula oxygen Cardiovascular status: blood pressure returned to baseline and stable Postop Assessment: no signs of nausea or vomiting Anesthetic complications: no    Last Vitals:  Vitals:   01/24/16 1624 01/24/16 1633  BP: (!) 155/79 133/76  Pulse: 74 72  Resp: 16 16  Temp:      Last Pain:  Vitals:   01/24/16 1614  TempSrc:   PainSc: 0-No pain                 Cleda MccreedyJoseph K Piscitello

## 2016-01-26 ENCOUNTER — Encounter: Payer: Self-pay | Admitting: Specialist

## 2017-09-03 ENCOUNTER — Ambulatory Visit
Admission: RE | Admit: 2017-09-03 | Discharge: 2017-09-03 | Disposition: A | Discharge status: Home / Self Care | Payer: Medicare HMO | Source: Ambulatory Visit | Attending: Specialist | Admitting: Specialist

## 2017-09-03 ENCOUNTER — Other Ambulatory Visit: Payer: Self-pay | Admitting: Specialist

## 2017-09-03 DIAGNOSIS — M19012 Primary osteoarthritis, left shoulder: Secondary | ICD-10-CM | POA: Diagnosis not present

## 2017-09-03 DIAGNOSIS — M25512 Pain in left shoulder: Secondary | ICD-10-CM | POA: Insufficient documentation

## 2017-09-03 DIAGNOSIS — M25532 Pain in left wrist: Secondary | ICD-10-CM | POA: Diagnosis not present

## 2019-08-21 IMAGING — CR DG WRIST 2V*L*
2 series · 2 of 2 positions shown · non-contrast
Comparison: None.

CLINICAL DATA: Left wrist pain for 2 weeks, no known injury,
initial encounter

EXAM:
LEFT WRIST - 2 VIEW

[wrist pa]
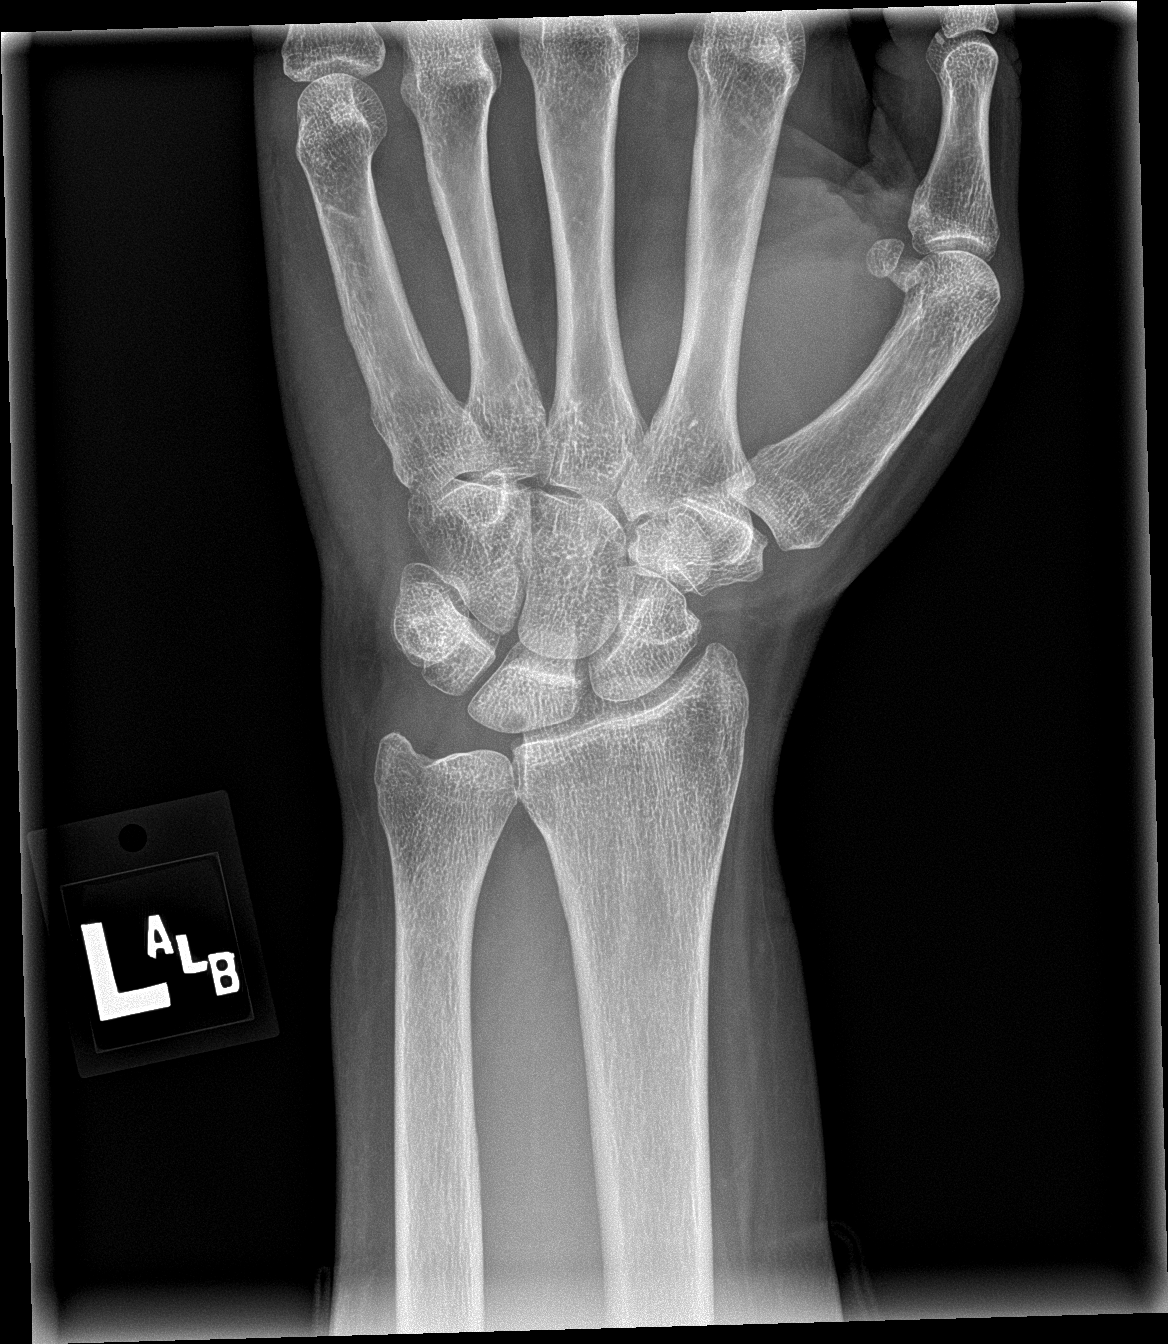

[wrist lat]
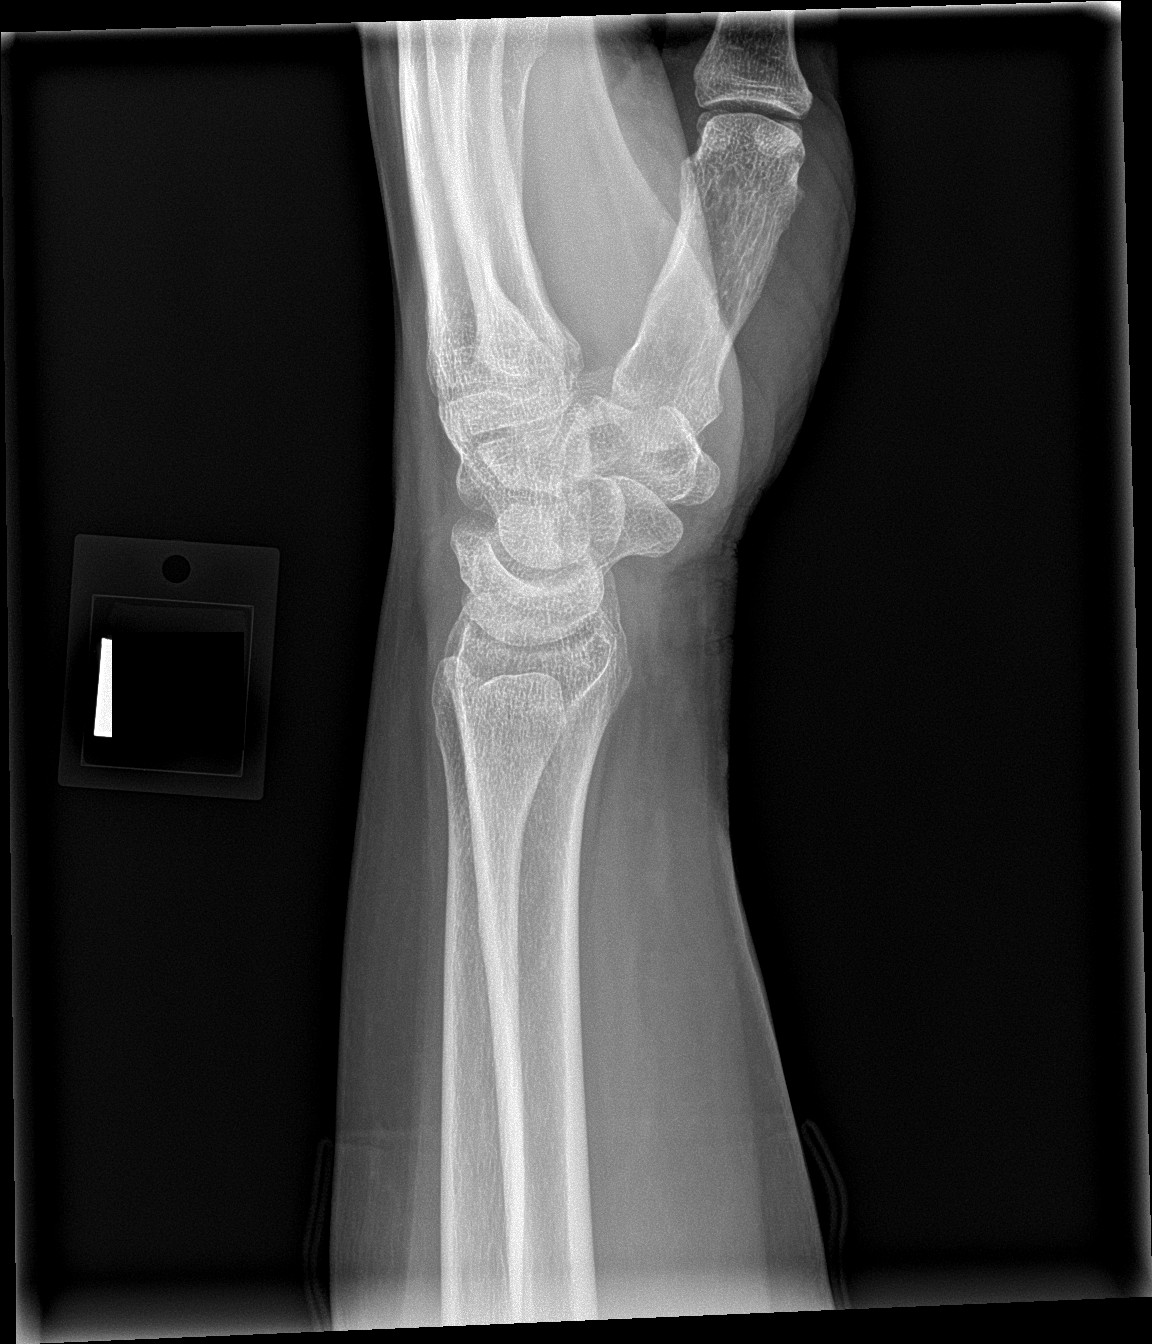

[2 of 2 positions shown; findings below may reference images not displayed]

FINDINGS: There is no evidence of fracture or dislocation. There is no
evidence of arthropathy or other focal bone abnormality. Soft
tissues are unremarkable.
IMPRESSION: No acute abnormality noted.
